# Patient Record
Sex: Female | Born: 1965 | Race: Black or African American | Hispanic: No | Marital: Single | Smoking: Current every day smoker
Health system: Southern US, Community
[De-identification: ages and names within clinical notes are randomized; demographics above are authoritative.]

## PROBLEM LIST (undated history)

## (undated) DIAGNOSIS — J40 Bronchitis, not specified as acute or chronic: Secondary | ICD-10-CM

## (undated) DIAGNOSIS — I10 Essential (primary) hypertension: Secondary | ICD-10-CM

## (undated) DIAGNOSIS — E119 Type 2 diabetes mellitus without complications: Secondary | ICD-10-CM

## (undated) DIAGNOSIS — F419 Anxiety disorder, unspecified: Secondary | ICD-10-CM

## (undated) DIAGNOSIS — K219 Gastro-esophageal reflux disease without esophagitis: Secondary | ICD-10-CM

## (undated) DIAGNOSIS — M792 Neuralgia and neuritis, unspecified: Secondary | ICD-10-CM

---

## 1998-03-06 ENCOUNTER — Other Ambulatory Visit: Admission: RE | Admit: 1998-03-06 | Discharge: 1998-03-06 | Payer: Self-pay

## 1998-06-04 ENCOUNTER — Emergency Department (HOSPITAL_COMMUNITY): Admission: EM | Admit: 1998-06-04 | Discharge: 1998-06-04 | Payer: Self-pay | Admitting: Emergency Medicine

## 1998-11-25 ENCOUNTER — Emergency Department (HOSPITAL_COMMUNITY): Admission: EM | Admit: 1998-11-25 | Discharge: 1998-11-25 | Payer: Self-pay | Admitting: Emergency Medicine

## 1999-08-16 ENCOUNTER — Encounter: Admission: RE | Admit: 1999-08-16 | Discharge: 1999-08-16 | Payer: Self-pay | Admitting: Hematology and Oncology

## 1999-09-04 ENCOUNTER — Encounter: Admission: RE | Admit: 1999-09-04 | Discharge: 1999-12-03 | Payer: Self-pay | Admitting: Orthopedic Surgery

## 1999-09-25 ENCOUNTER — Encounter: Admission: RE | Admit: 1999-09-25 | Discharge: 1999-09-25 | Payer: Self-pay | Admitting: Internal Medicine

## 1999-12-10 ENCOUNTER — Encounter: Admission: RE | Admit: 1999-12-10 | Discharge: 2000-03-09 | Payer: Self-pay | Admitting: Orthopedic Surgery

## 2000-07-14 ENCOUNTER — Emergency Department (HOSPITAL_COMMUNITY): Admission: EM | Admit: 2000-07-14 | Discharge: 2000-07-14 | Payer: Self-pay | Admitting: Internal Medicine

## 2000-07-15 ENCOUNTER — Emergency Department (HOSPITAL_COMMUNITY): Admission: EM | Admit: 2000-07-15 | Discharge: 2000-07-16 | Payer: Self-pay | Admitting: Emergency Medicine

## 2000-08-14 ENCOUNTER — Encounter: Admission: RE | Admit: 2000-08-14 | Discharge: 2000-08-14 | Payer: Self-pay | Admitting: Hematology and Oncology

## 2000-09-15 ENCOUNTER — Encounter: Admission: RE | Admit: 2000-09-15 | Discharge: 2000-09-15 | Payer: Self-pay | Admitting: Hematology and Oncology

## 2000-09-18 ENCOUNTER — Encounter: Admission: RE | Admit: 2000-09-18 | Discharge: 2000-12-17 | Payer: Self-pay | Admitting: Internal Medicine

## 2000-10-07 ENCOUNTER — Encounter: Admission: RE | Admit: 2000-10-07 | Discharge: 2000-10-07 | Payer: Self-pay | Admitting: Internal Medicine

## 2000-12-08 ENCOUNTER — Encounter: Admission: RE | Admit: 2000-12-08 | Discharge: 2000-12-08 | Payer: Self-pay

## 2001-01-20 ENCOUNTER — Encounter: Admission: RE | Admit: 2001-01-20 | Discharge: 2001-01-20 | Payer: Self-pay | Admitting: Hematology and Oncology

## 2001-02-06 ENCOUNTER — Emergency Department (HOSPITAL_COMMUNITY): Admission: EM | Admit: 2001-02-06 | Discharge: 2001-02-06 | Payer: Self-pay | Admitting: Emergency Medicine

## 2001-04-13 ENCOUNTER — Encounter: Admission: RE | Admit: 2001-04-13 | Discharge: 2001-04-13 | Payer: Self-pay | Admitting: Internal Medicine

## 2001-04-25 ENCOUNTER — Emergency Department (HOSPITAL_COMMUNITY): Admission: EM | Admit: 2001-04-25 | Discharge: 2001-04-26 | Payer: Self-pay | Admitting: Emergency Medicine

## 2001-05-04 ENCOUNTER — Emergency Department (HOSPITAL_COMMUNITY): Admission: EM | Admit: 2001-05-04 | Discharge: 2001-05-04 | Payer: Self-pay | Admitting: Emergency Medicine

## 2001-05-12 ENCOUNTER — Emergency Department (HOSPITAL_COMMUNITY): Admission: EM | Admit: 2001-05-12 | Discharge: 2001-05-12 | Payer: Self-pay | Admitting: *Deleted

## 2001-08-13 ENCOUNTER — Emergency Department (HOSPITAL_COMMUNITY): Admission: EM | Admit: 2001-08-13 | Discharge: 2001-08-13 | Payer: Self-pay | Admitting: Emergency Medicine

## 2001-10-14 ENCOUNTER — Encounter: Admission: RE | Admit: 2001-10-14 | Discharge: 2001-10-14 | Payer: Self-pay | Admitting: Internal Medicine

## 2001-10-14 ENCOUNTER — Ambulatory Visit (HOSPITAL_COMMUNITY): Admission: RE | Admit: 2001-10-14 | Discharge: 2001-10-14 | Payer: Self-pay | Admitting: Internal Medicine

## 2001-10-26 ENCOUNTER — Encounter: Admission: RE | Admit: 2001-10-26 | Discharge: 2002-01-24 | Payer: Self-pay | Admitting: Internal Medicine

## 2001-11-19 ENCOUNTER — Encounter: Admission: RE | Admit: 2001-11-19 | Discharge: 2001-11-19 | Payer: Self-pay | Admitting: Obstetrics

## 2001-12-03 ENCOUNTER — Encounter: Admission: RE | Admit: 2001-12-03 | Discharge: 2001-12-03 | Payer: Self-pay | Admitting: Obstetrics

## 2002-02-05 ENCOUNTER — Inpatient Hospital Stay (HOSPITAL_COMMUNITY): Admission: AD | Admit: 2002-02-05 | Discharge: 2002-02-05 | Payer: Self-pay | Admitting: Obstetrics and Gynecology

## 2002-02-05 ENCOUNTER — Encounter: Payer: Self-pay | Admitting: *Deleted

## 2002-02-09 ENCOUNTER — Encounter: Admission: RE | Admit: 2002-02-09 | Discharge: 2002-02-09 | Payer: Self-pay | Admitting: *Deleted

## 2002-02-10 ENCOUNTER — Ambulatory Visit (HOSPITAL_COMMUNITY): Admission: RE | Admit: 2002-02-10 | Discharge: 2002-02-10 | Payer: Self-pay | Admitting: *Deleted

## 2002-02-15 ENCOUNTER — Emergency Department (HOSPITAL_COMMUNITY): Admission: EM | Admit: 2002-02-15 | Discharge: 2002-02-15 | Payer: Self-pay

## 2003-09-28 ENCOUNTER — Encounter: Admission: RE | Admit: 2003-09-28 | Discharge: 2003-09-28 | Payer: Self-pay | Admitting: *Deleted

## 2003-10-12 ENCOUNTER — Encounter: Admission: RE | Admit: 2003-10-12 | Discharge: 2003-10-12 | Payer: Self-pay | Admitting: *Deleted

## 2003-10-13 ENCOUNTER — Ambulatory Visit (HOSPITAL_COMMUNITY): Admission: RE | Admit: 2003-10-13 | Discharge: 2003-10-13 | Payer: Self-pay | Admitting: *Deleted

## 2003-11-10 ENCOUNTER — Inpatient Hospital Stay (HOSPITAL_COMMUNITY): Admission: AD | Admit: 2003-11-10 | Discharge: 2003-11-12 | Payer: Self-pay | Admitting: Family Medicine

## 2003-12-28 ENCOUNTER — Encounter: Admission: RE | Admit: 2003-12-28 | Discharge: 2003-12-28 | Payer: Self-pay | Admitting: *Deleted

## 2004-01-12 ENCOUNTER — Encounter: Admission: RE | Admit: 2004-01-12 | Discharge: 2004-01-12 | Payer: Self-pay | Admitting: *Deleted

## 2004-01-26 ENCOUNTER — Encounter: Admission: RE | Admit: 2004-01-26 | Discharge: 2004-01-26 | Payer: Self-pay | Admitting: *Deleted

## 2004-03-01 ENCOUNTER — Encounter: Admission: RE | Admit: 2004-03-01 | Discharge: 2004-03-01 | Payer: Self-pay | Admitting: *Deleted

## 2004-03-08 ENCOUNTER — Encounter: Admission: RE | Admit: 2004-03-08 | Discharge: 2004-03-08 | Payer: Self-pay | Admitting: Family Medicine

## 2004-03-09 ENCOUNTER — Inpatient Hospital Stay (HOSPITAL_COMMUNITY): Admission: AD | Admit: 2004-03-09 | Discharge: 2004-03-09 | Payer: Self-pay | Admitting: Obstetrics and Gynecology

## 2004-03-22 ENCOUNTER — Inpatient Hospital Stay (HOSPITAL_COMMUNITY): Admission: AD | Admit: 2004-03-22 | Discharge: 2004-03-25 | Payer: Self-pay | Admitting: Obstetrics and Gynecology

## 2004-03-26 ENCOUNTER — Inpatient Hospital Stay (HOSPITAL_COMMUNITY): Admission: AD | Admit: 2004-03-26 | Discharge: 2004-03-29 | Payer: Self-pay | Admitting: Obstetrics & Gynecology

## 2004-04-05 ENCOUNTER — Inpatient Hospital Stay (HOSPITAL_COMMUNITY): Admission: AD | Admit: 2004-04-05 | Discharge: 2004-04-05 | Payer: Self-pay | Admitting: Obstetrics & Gynecology

## 2010-11-24 ENCOUNTER — Encounter: Payer: Self-pay | Admitting: Obstetrics & Gynecology

## 2014-04-09 ENCOUNTER — Emergency Department (HOSPITAL_COMMUNITY)
Admission: EM | Admit: 2014-04-09 | Discharge: 2014-04-09 | Disposition: A | Discharge status: Home / Self Care | Payer: Medicaid Other | Source: Home / Self Care | Attending: Emergency Medicine | Admitting: Emergency Medicine

## 2014-04-09 ENCOUNTER — Encounter (HOSPITAL_COMMUNITY): Payer: Self-pay | Admitting: Emergency Medicine

## 2014-04-09 ENCOUNTER — Emergency Department (HOSPITAL_COMMUNITY): Payer: Medicaid Other

## 2014-04-09 DIAGNOSIS — Z5900 Homelessness unspecified: Secondary | ICD-10-CM

## 2014-04-09 DIAGNOSIS — K219 Gastro-esophageal reflux disease without esophagitis: Secondary | ICD-10-CM | POA: Diagnosis present

## 2014-04-09 DIAGNOSIS — G589 Mononeuropathy, unspecified: Secondary | ICD-10-CM

## 2014-04-09 DIAGNOSIS — F1110 Opioid abuse, uncomplicated: Secondary | ICD-10-CM | POA: Insufficient documentation

## 2014-04-09 DIAGNOSIS — E139 Other specified diabetes mellitus without complications: Secondary | ICD-10-CM

## 2014-04-09 DIAGNOSIS — Z794 Long term (current) use of insulin: Secondary | ICD-10-CM

## 2014-04-09 DIAGNOSIS — Z79899 Other long term (current) drug therapy: Secondary | ICD-10-CM | POA: Insufficient documentation

## 2014-04-09 DIAGNOSIS — R1013 Epigastric pain: Secondary | ICD-10-CM | POA: Insufficient documentation

## 2014-04-09 DIAGNOSIS — E119 Type 2 diabetes mellitus without complications: Secondary | ICD-10-CM

## 2014-04-09 DIAGNOSIS — M79606 Pain in leg, unspecified: Secondary | ICD-10-CM

## 2014-04-09 DIAGNOSIS — F411 Generalized anxiety disorder: Secondary | ICD-10-CM

## 2014-04-09 DIAGNOSIS — F1510 Other stimulant abuse, uncomplicated: Secondary | ICD-10-CM | POA: Insufficient documentation

## 2014-04-09 DIAGNOSIS — M79609 Pain in unspecified limb: Secondary | ICD-10-CM | POA: Insufficient documentation

## 2014-04-09 DIAGNOSIS — T50992A Poisoning by other drugs, medicaments and biological substances, intentional self-harm, initial encounter: Secondary | ICD-10-CM | POA: Diagnosis present

## 2014-04-09 DIAGNOSIS — F3132 Bipolar disorder, current episode depressed, moderate: Secondary | ICD-10-CM

## 2014-04-09 DIAGNOSIS — I10 Essential (primary) hypertension: Secondary | ICD-10-CM | POA: Insufficient documentation

## 2014-04-09 DIAGNOSIS — F17200 Nicotine dependence, unspecified, uncomplicated: Secondary | ICD-10-CM | POA: Diagnosis present

## 2014-04-09 DIAGNOSIS — F329 Major depressive disorder, single episode, unspecified: Secondary | ICD-10-CM

## 2014-04-09 DIAGNOSIS — Y92009 Unspecified place in unspecified non-institutional (private) residence as the place of occurrence of the external cause: Secondary | ICD-10-CM

## 2014-04-09 DIAGNOSIS — F3289 Other specified depressive episodes: Secondary | ICD-10-CM | POA: Insufficient documentation

## 2014-04-09 DIAGNOSIS — T50912A Poisoning by multiple unspecified drugs, medicaments and biological substances, intentional self-harm, initial encounter: Secondary | ICD-10-CM

## 2014-04-09 DIAGNOSIS — F22 Delusional disorders: Secondary | ICD-10-CM

## 2014-04-09 DIAGNOSIS — T502X1A Poisoning by carbonic-anhydrase inhibitors, benzothiadiazides and other diuretics, accidental (unintentional), initial encounter: Secondary | ICD-10-CM | POA: Diagnosis present

## 2014-04-09 DIAGNOSIS — F419 Anxiety disorder, unspecified: Secondary | ICD-10-CM

## 2014-04-09 DIAGNOSIS — T50991A Poisoning by other drugs, medicaments and biological substances, accidental (unintentional), initial encounter: Principal | ICD-10-CM | POA: Diagnosis present

## 2014-04-09 DIAGNOSIS — T50902A Poisoning by unspecified drugs, medicaments and biological substances, intentional self-harm, initial encounter: Secondary | ICD-10-CM | POA: Diagnosis present

## 2014-04-09 DIAGNOSIS — G629 Polyneuropathy, unspecified: Secondary | ICD-10-CM

## 2014-04-09 DIAGNOSIS — F3130 Bipolar disorder, current episode depressed, mild or moderate severity, unspecified: Secondary | ICD-10-CM | POA: Diagnosis present

## 2014-04-09 DIAGNOSIS — F32A Depression, unspecified: Secondary | ICD-10-CM

## 2014-04-09 DIAGNOSIS — E876 Hypokalemia: Secondary | ICD-10-CM | POA: Diagnosis present

## 2014-04-09 DIAGNOSIS — Z6835 Body mass index (BMI) 35.0-35.9, adult: Secondary | ICD-10-CM

## 2014-04-09 HISTORY — DX: Essential (primary) hypertension: I10

## 2014-04-09 HISTORY — DX: Anxiety disorder, unspecified: F41.9

## 2014-04-09 HISTORY — DX: Gastro-esophageal reflux disease without esophagitis: K21.9

## 2014-04-09 HISTORY — DX: Type 2 diabetes mellitus without complications: E11.9

## 2014-04-09 LAB — URINALYSIS, ROUTINE W REFLEX MICROSCOPIC
Bilirubin Urine: NEGATIVE
Glucose, UA: 500 mg/dL — AB
Hgb urine dipstick: NEGATIVE
Ketones, ur: NEGATIVE mg/dL
Leukocytes, UA: NEGATIVE
Nitrite: NEGATIVE
Protein, ur: NEGATIVE mg/dL
Specific Gravity, Urine: 1.013 (ref 1.005–1.030)
Urobilinogen, UA: 0.2 mg/dL (ref 0.0–1.0)
pH: 7 (ref 5.0–8.0)

## 2014-04-09 LAB — COMPREHENSIVE METABOLIC PANEL
ALT: 13 U/L (ref 0–35)
AST: 10 U/L (ref 0–37)
Albumin: 3.7 g/dL (ref 3.5–5.2)
Alkaline Phosphatase: 75 U/L (ref 39–117)
BUN: 18 mg/dL (ref 6–23)
CO2: 26 mEq/L (ref 19–32)
Calcium: 9 mg/dL (ref 8.4–10.5)
Chloride: 99 mEq/L (ref 96–112)
Creatinine, Ser: 0.64 mg/dL (ref 0.50–1.10)
GFR calc Af Amer: >90 mL/min (ref >90)
GFR calc non Af Amer: >90 mL/min (ref >90)
Glucose, Bld: 248 mg/dL — ABNORMAL HIGH (ref 70–99)
Potassium: 3.5 mEq/L — ABNORMAL LOW (ref 3.7–5.3)
Sodium: 138 mEq/L (ref 137–147)
Total Bilirubin: 0.3 mg/dL (ref 0.3–1.2)
Total Protein: 7 g/dL (ref 6.0–8.3)

## 2014-04-09 LAB — CBC WITH DIFFERENTIAL/PLATELET
Basophils Absolute: 0.1 10*3/uL (ref 0.0–0.1)
Basophils Relative: 1 % (ref 0–1)
Eosinophils Absolute: 0.2 10*3/uL (ref 0.0–0.7)
Eosinophils Relative: 3 % (ref 0–5)
HCT: 37.9 % (ref 36.0–46.0)
Hemoglobin: 13 g/dL (ref 12.0–15.0)
Lymphocytes Relative: 31 % (ref 12–46)
Lymphs Abs: 2.2 10*3/uL (ref 0.7–4.0)
MCH: 31.9 pg (ref 26.0–34.0)
MCHC: 34.3 g/dL (ref 30.0–36.0)
MCV: 93.1 fL (ref 78.0–100.0)
Monocytes Absolute: 0.8 10*3/uL (ref 0.1–1.0)
Monocytes Relative: 11 % (ref 3–12)
Neutro Abs: 3.8 10*3/uL (ref 1.7–7.7)
Neutrophils Relative %: 54 % (ref 43–77)
Platelets: 345 10*3/uL (ref 150–400)
RBC: 4.07 MIL/uL (ref 3.87–5.11)
RDW: 13.2 % (ref 11.5–15.5)
WBC: 7.1 10*3/uL (ref 4.0–10.5)

## 2014-04-09 LAB — RAPID URINE DRUG SCREEN, HOSP PERFORMED
Amphetamines: POSITIVE — AB
Barbiturates: NOT DETECTED
Benzodiazepines: NOT DETECTED
Cocaine: NOT DETECTED
Opiates: POSITIVE — AB
Tetrahydrocannabinol: NOT DETECTED

## 2014-04-09 LAB — LIPASE, BLOOD: Lipase: 38 U/L (ref 11–59)

## 2014-04-09 LAB — TROPONIN I: Troponin I: <0.3 ng/mL (ref <0.30)

## 2014-04-09 MED ORDER — TRAMADOL HCL 50 MG PO TABS: 50.0000 mg | ORAL_TABLET | Freq: Once | ORAL | Status: DC

## 2014-04-09 MED ORDER — MORPHINE SULFATE 4 MG/ML IJ SOLN
4.0000 mg | Freq: Once | INTRAMUSCULAR | Status: AC
  Administered 2014-04-09: 4 mg via INTRAVENOUS
  Filled 2014-04-09: qty 1

## 2014-04-09 MED ORDER — TRAMADOL HCL 50 MG PO TABS
50.0000 mg | ORAL_TABLET | Freq: Once | ORAL | Status: AC
  Administered 2014-04-09: 50 mg via ORAL
  Filled 2014-04-09: qty 1

## 2014-04-09 MED ORDER — LORAZEPAM 1 MG PO TABS
1.0000 mg | ORAL_TABLET | Freq: Once | ORAL | Status: AC | PRN
  Administered 2014-04-09: 1 mg via ORAL
  Filled 2014-04-09: qty 1

## 2014-04-09 MED ORDER — IOHEXOL 300 MG/ML  SOLN
100.0000 mL | Freq: Once | INTRAMUSCULAR | Status: AC | PRN
  Administered 2014-04-09: 100 mL via INTRAVENOUS

## 2014-04-09 MED ORDER — TRAMADOL HCL 50 MG PO TABS: 50.0000 mg | ORAL_TABLET | Freq: Four times a day (QID) | ORAL | Status: DC | PRN

## 2014-04-09 MED ORDER — IOHEXOL 300 MG/ML  SOLN
50.0000 mL | Freq: Once | INTRAMUSCULAR | Status: AC | PRN
  Administered 2014-04-09: 50 mL via ORAL

## 2014-04-09 NOTE — ED Notes (Signed)
Pt presents with c/o left leg pain. This is the third time that pt has been seen in the ER today. Pt was d/c less than three hours ago and has not left the lobby since then. Pt says that her left leg has been hurting and she wants something for pain. Pt is ambulatory to triage.

## 2014-04-09 NOTE — Discharge Instructions (Signed)
Do not hesitate to return to the Emergency Department for any new, worsening or concerning symptoms.   If you do not have a primary care doctor you can establish one at the   [facility]: [address] [place] [address] [phone]  After you establish care. Let them know you were seen in the emergency room. They must obtain records for further management.    Emergency Department Resource Guide 1) Find a Doctor and Pay Out of Pocket Although you won't have to find out who is covered by your insurance plan, it is a good idea to ask around and get recommendations. You will then need to call the office and see if the doctor you have chosen will accept you as a new patient and what types of options they offer for patients who are self-pay. Some doctors offer discounts or will set up payment plans for their patients who do not have insurance, but you will need to ask so you aren't surprised when you get to your appointment.  2) Contact Your Local Health Department Not all health departments have doctors that can see patients for sick visits, but many do, so it is worth a call to see if yours does. If you don't know where your local health department is, you can check in your phone book. The CDC also has a tool to help you locate your state's health department, and many state websites also have listings of all of their local health departments.  3) Find a Walk-in Clinic If your illness is not likely to be very severe or complicated, you may want to try a walk in clinic. These are popping up all over the country in pharmacies, drugstores, and shopping centers. They're usually staffed by nurse practitioners or physician assistants that have been trained to treat common illnesses and complaints. They're usually fairly quick and inexpensive. However, if you have serious medical issues or chronic medical problems, these are probably not your best option.  No Primary Care  Doctor: - Call Health Connect at  [phone] - they can help you locate a primary care doctor that  accepts your insurance, provides certain services, etc. - Physician Referral Service- [phone]  Chronic Pain Problems: Organization         Address  Phone   Notes  [facility]  [phone] Patients need to be referred by their primary care doctor.   Medication Assistance: Organization         Address  Phone   Notes  [facility] Medication [facility] [address] [place] [place], [place] [address] [phone] --Must be a resident of [facility] -- Must have NO insurance coverage whatsoever (no Medicaid/ Medicare, etc.) -- The pt. MUST have a primary care doctor that directs their care regularly and follows them in the community   [facility]  [phone]   [employer]  [phone]    Agencies that provide inexpensive medical care: Organization         Address  Phone   Notes  [facility]  [phone]   [facility]    [phone]   [facility] [address] [place], [place] [address] [phone]   [facility] [address] [place], [place] [phone]   Planned Parenthood    [phone]   [facility]    [phone]   [facility] and [facility]  [address] [address], [place] Phone:  [phone], Fax:  [phone] Hours of Operation:  9 am - 6 pm, M-F.  Also accepts Medicaid/Medicare and self-pay.  [facility] for [place] [address] [place] [address], [address], [place] Phone: [phone], Fax: [phone]. Hours of Operation:  8:30 am - 5:30 pm, M-F.  Also accepts Medicaid and self-pay.  [facility] [address], [place] Phone: [phone]   [place], [place], [place] [phone], Ext. [phone] Mondays & Thursdays: 7-9 AM.  First 15 patients are seen on a first  come, first serve basis.    [place] Providers:  Organization         Address  Phone   Notes  [facility] [address], [place] [phone] Also accepts self-pay patients.  [facility] [address], [place]  [phone]   [place], [address], [place] [phone]   [facility] [address], [place] [phone]   Lucianne Lei [address], [place]   [phone] Only accepts [place] Access [place] patients after they have their name applied to their card.   Self-Pay (no insurance) in [facility]:  Organization         Address  Phone   Notes  Sickle Cell Patients, [facility] [place] [phone]   [facility] [place] [phone]   [facility] [place]  [place], [place],  [phone]   [facility]/Dr. Osei-Bonsu  [address] [place] or [address], [place] [phone] Phone number for both [place] and [place] locations is the same.  Urgent Medical and [facility] [address], [place] [phone]   [facility] [address] [place] or [address] [phone]   [facility] [address] [place] [phone], phone; [phone], fax Sees patients 1st and 3rd Saturday of every month.  Must not qualify for public or private insurance (i.e. Medicaid, Medicare, [place] Health Choice, Veterans' Benefits)  Household income should be no more than 200% of the poverty level The clinic cannot treat you if you are pregnant or think you are pregnant  Sexually transmitted diseases are not treated at the clinic.    Dental Care: Organization          Address  Phone  Notes  [facility] Department of [facility] [place] [phone] Accepts children up to age 5 who are enrolled in [place] or [place]; pregnant women with a Medicaid card; and children who have applied for Medicaid or [place] Health Choice, but were declined, whose parents can pay a reduced fee at time of service.  [facility] Department of [facility]  [address], [place] [phone] Accepts children up to age 14 who are enrolled in [place] or [place]; pregnant women with a Medicaid card; and children who have applied for Medicaid or  Health Choice, but were declined, whose parents can pay a reduced fee at time of service.  [facility] Adult Dental Access PROGRAM  [place], [place] [phone]  Patients are seen by appointment only. Walk-ins are not accepted. [place] will see patients 62 years of age and older. Monday - Tuesday (8am-5pm) Most Wednesdays (8:30-5pm) $30 per visit, cash only  [facility] Adult Dental Access PROGRAM  [address], [facility] [phone] Patients are seen by appointment only. Walk-ins are not accepted. [place] will see patients 76 years of age and older. One Wednesday Evening (Monthly: Volunteer Based).  $30 per visit, cash only  [place]  [phone] for adults; Children under age 48, call Graduate Pediatric Dentistry at [phone]. Children aged 49-14, please call [phone] to request a pediatric application.  Dental services are provided in all areas of dental care including fillings, crowns and bridges, complete and partial dentures, implants, gum treatment, root canals, and extractions. Preventive care is also provided. Treatment is provided to both adults and children. Patients are selected via a lottery and there is often a waiting list.   [facility] [address] [place]  [phone] [email or web address]   [facility] [address] [place], [place] [phone], Ext. [phone] Second and Fourth Thursday of each month, opens at 6:30 AM; Clinic ends at 9 AM.  Patients are seen on a first-come first-served basis, and a limited number are seen during each clinic.   [facility]  [address] Hillard Danker [place], [place] [phone]   Eligibility Requirements You must have lived in [place], [place], or [place] counties for at least the last three months.   You cannot be eligible for state or federal sponsored [employer], including [employer], [place], or [employer].   You generally cannot be eligible for healthcare insurance through your employer.    How to apply: Eligibility screenings are held every Tuesday and Wednesday afternoon from 1:00 pm until 4:00 pm. You do not need an appointment for the interview!  [facility] [address] [place], [place]   [place]  [place] Department  [place]  [phone]    Behavioral Health Resources in the Community: Intensive Outpatient Programs Organization         Address  Phone  Notes  [place]. [address] [place], [place] [phone]   [facility] [place] [facility] Outpatient [address], [place], [place]   ADS: [facility] [address] [place], [place]   [place] [address],  [place], [place] or [phone]   Substance Abuse Resources Organization         Address  Phone  Notes  Al[facility] Drug Services  [phone]   [place]  [phone]   [facility] [place]   Chinita Pester  [phone]   Residential & Outpatient Substance Abuse Program  [phone]   Psychological  Services Organization         Address  Phone  Notes  [facility] [place]  [place]  [phone]   [place] [address], [place] or [phone]    Mobile Crisis Teams Organization         Address  Phone  Notes  [facility], Mobile Crisis Care Unit  [phone]   [facility]  [address] [place], [place]   [facility] [address], [place]  [place] [phone]    Self-Help/Support Groups Organization         Address  Phone             Notes  [facility] [place] - variety of support groups  [place] Call for more information  Narcotics Anonymous (NA), [facility] [address], [employer] [place]  2 meetings at this location   Special educational needs teacher         Address  Phone  Notes  [facility]idential Treatment [place],    [place]  [phone]   [facility]  [address] [place] [address], [place], [place]   [place], [place] [phone] Admissions: 8am-3pm M-F  Incentives Substance [place] [address],    [place], [place] [phone]   [facility] [address] [place], [place], [employer]   [facility] [address]  [place], [place]   [facility] - Intensive Outpatient [address], [place], [place]   [facility] [place]  [place], [place] [phone] or [phone]   [facility] ([facility]) [address], [place], [place] Accepts Medicaid  [facility] [place] [address]  [place] [phone] Substance Abuse/Addiction Treatment   [facility] Organization         Address  Phone  Notes  [facility]  [phone]   Domenic Schwab, PhD [address] [place], [place]   [phone] or [phone]   Luis Lopez [place], [place] [phone]   [facility] [address], [place], [place] [phone] Insurance/Medicaid/sponsorship through [facility] and Families [address], [address]                                    [place], [place] [phone] [place] [address], [place] [phone]    Dr. Adele Schilder  [phone]   [facility] Dept. 1) [address], [place] 2) [place] 3)  [place] [address], [place] [phone]  [phone]   [place] [phone] or [phone] (After Hours)

## 2014-04-09 NOTE — ED Notes (Signed)
Per PA Joni Reining pt does not want lab drawn at this time

## 2014-04-09 NOTE — ED Provider Notes (Signed)
Medical screening examination/treatment/procedure(s) were conducted as a shared visit with non-physician practitioner(s) and myself.  I personally evaluated the patient during the encounter.  Patient is oriented to person place and time sheet states she has anxiety and depression but denies homicidal ideation suicidal ideation threat to harm her self or others or hallucinations and states she is still able to function in a safe place at the shelter where she is temporarily staying and she states she came to the emergency room seeking assistance so she can find resources to help her with her chronic anxiety and chronic depression and she does not want to stay in the emergency department any longer at this time she is happy to be discharged with a resource guide for behavioral health and will followup with [facility] or behavioral health hospital. It does not appear that she needs to be involuntarily committed at this time. [phone]   Hurman Horn, MD 04/10/14 1451

## 2014-04-09 NOTE — ED Notes (Signed)
Green tank top, black sports bra, brown panties, blue shorts,white socks(patient refuses to put yellow socks on)

## 2014-04-09 NOTE — ED Notes (Signed)
Bed: XK48 Expected date:  Expected time:  Means of arrival:  Comments: EMS 48yo anxiety

## 2014-04-09 NOTE — ED Notes (Signed)
Original call came into EMS as anxiety once they calmed patient down,  Her abdominal pain and left leg pain are now her complaint,  They have been hurting about 3 days

## 2014-04-09 NOTE — ED Notes (Addendum)
Pt is yelling at staff members and is very aggressive with anyone who walks into her room at this time. [employer] and security at bedside to speak with pt as she is saying that people in the lobby are threatening to hurt her. Explained to pt that we could get the [facility] officer into her room to address her concerns and take down information about the threats that were being made to her. Pt was d/c with a diagnosis of paranoia earlier and has been aggressive since she checked back in.

## 2014-04-09 NOTE — Discharge Instructions (Signed)
Take 

## 2014-04-09 NOTE — ED Notes (Signed)
Pt given warm blankets for comfort measures

## 2014-04-09 NOTE — ED Provider Notes (Signed)
CSN: [id number]     Arrival date & time 04/09/14  2059 History   None   This chart was scribed for non-physician practitioner, Ebbie Ridge, PA, working with Hurman Horn, MD by Marica Otter, ED Scribe. This patient was seen in room WTR1/WLPT1 and the patient's care was started at 10:21 PM.  Chief Complaint  Patient presents with  . Leg Pain    The history is provided by the patient. No language interpreter was used.   HPI Comments: Terri SCHARES is a 48 y.o. female, with a history of DM, HTN, GERD and anxiety, who presents to the Emergency Department complaining of left leg pain onset 1 year ago. Pt reports that the pain begins in her left thigh and travels down the length of her leg. Pt reports that the pain "shoots" down her leg.   Pt was seen at the ED on 2 other occassions today.   Past Medical History  Diagnosis Date  . Diabetes mellitus without complication   . Hypertension   . GERD (gastroesophageal reflux disease)   . Anxiety    History reviewed. No pertinent past surgical history. No family history on file. History  Substance Use Topics  . Smoking status: Current Every Day Smoker  . Smokeless tobacco: Not on file  . Alcohol Use: No   OB History   Grav Para Term Preterm Abortions TAB SAB Ect Mult Living                 Review of Systems  A complete 10 system review of systems was obtained and all systems are negative except as noted in the HPI and PMH.    Allergies  Review of patient's allergies indicates no known allergies.  Home Medications   Prior to Admission medications   Medication Sig Start Date End Date Taking? Authorizing Provider  gabapentin (NEURONTIN) 300 MG capsule Take 300 mg by mouth 3 (three) times daily.   Yes Historical Provider, MD  hydrochlorothiazide (HYDRODIURIL) 25 MG tablet Take 25 mg by mouth daily.   Yes Historical Provider, MD  ibuprofen (ADVIL,MOTRIN) 800 MG tablet Take 800 mg by mouth every 8 (eight) hours as needed for moderate  pain.   Yes Historical Provider, MD  insulin detemir (LEVEMIR) 100 UNIT/ML injection Inject 35 Units into the skin at bedtime.   Yes Historical Provider, MD  metFORMIN (GLUCOPHAGE) 500 MG tablet Take 1,000 mg by mouth daily with breakfast.   Yes Historical Provider, MD  ranitidine (ZANTAC) 150 MG tablet Take 150 mg by mouth 2 (two) times daily.   Yes Historical Provider, MD  risperiDONE (RISPERDAL) 2 MG tablet Take 2 mg by mouth at bedtime.   Yes Historical Provider, MD   BP 133/74  Pulse 94  Temp(Src) 98.7 F (37.1 C) (Oral)  Resp 14  SpO2 99% Physical Exam  Nursing note and vitals reviewed. Constitutional: She is oriented to person, place, and time. She appears well-developed and well-nourished. No distress.  HENT:  Head: Normocephalic and atraumatic.  Eyes: Pupils are equal, round, and reactive to light.  Pulmonary/Chest: Effort normal.  Musculoskeletal: Normal range of motion. She exhibits tenderness. She exhibits no edema.  Neurological: She is alert and oriented to person, place, and time.  Skin: Skin is warm and dry.  Psychiatric: She has a normal mood and affect. Her behavior is normal.    ED Course  Procedures (including critical care time) DIAGNOSTIC STUDIES: Oxygen Saturation is 98% on RA, normal by my interpretation.  COORDINATION OF CARE: 10:23 PM-Discussed treatment plan which includes meds with pt. Patient verbalizes understanding and agrees with treatment plan.  Labs Review Labs Reviewed - No data to display  Imaging Review Ct Abdomen Pelvis W Contrast  04/09/2014   CLINICAL DATA:  Abdominal pain.  EXAM: CT ABDOMEN AND PELVIS WITH CONTRAST  TECHNIQUE: Multidetector CT imaging of the abdomen and pelvis was performed using the standard protocol following bolus administration of intravenous contrast.  CONTRAST:  50mL OMNIPAQUE IOHEXOL 300 MG/ML SOLN, 100mL OMNIPAQUE IOHEXOL 300 MG/ML SOLN  COMPARISON:  11/11/2003.  FINDINGS: The lung bases are clear except for  streaky areas of atelectasis and scarring. Heart is normal in size. No pericardial effusion.  The liver is unremarkable. Multiple small gallstones are noted the gallbladder. No common bile duct dilatation. The pancreas appears normal. The spleen is normal in size no focal lesions. The adrenal glands and kidneys are unremarkable.  The stomach, duodenum, small bowel and colon are grossly normal. No inflammatory changes, mass lesions or obstructive findings. The appendix is normal. No mesenteric or retroperitoneal mass or adenopathy. The aorta and branch vessels are patent.  The uterus contains an IUD. There is a calcified uterine fibroid noted. Tubal ligation clips are present. The ovaries are normal. The bladder is normal. No pelvic mass, adenopathy or free pelvic fluid collections. No inguinal mass or adenopathy.  The bony structures are unremarkable.  IMPRESSION: No acute abdominal/ pelvic findings, mass lesions or adenopathy.  Chololithiasis.   Electronically Signed   By: Loralie ChampagneMark  Gallerani M.D.   On: 04/09/2014 04:00   Patient is here for treatment for her diabetic neuropathy.  The patient, states she's on gabapentin.  For this.  She states the pain shoots from her knee down to her feet, especially on the left.  The patient is advised followup with her primary care Dr. told to return here as needed.  This is the patient's third visit to the emergency department.  She did not have any other complaints at this time  I personally performed the services described in this documentation, which was scribed in my presence. The recorded information has been reviewed and is accurate.   Carlyle Dollyhristopher W Mella Inclan, PA-C 04/10/14 [phone]
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
## 2014-04-09 NOTE — Discharge Instructions (Signed)
Return here as needed. Follow up with your doctor. °

## 2014-04-09 NOTE — ED Provider Notes (Signed)
CSN: [id number]     Arrival date & time 04/09/14  0034 History   First MD Initiated Contact with Patient 04/09/14 0049     Chief Complaint  Patient presents with  . Anxiety  . Abdominal Pain  . Leg Pain     (Consider location/radiation/quality/duration/timing/severity/associated sxs/prior Treatment) HPI Comments: 48 year old female with history of diabetes, high blood pressure, reflux, anxiety presents with abdominal pain and anxiety symptoms. For the past 3 days patient's had worsening epigastric pain sharp radiating down the suprapubic region. No specific worsening factors last up to 30 minutes at a time and does not radiate to the back. Not worse after eating. No chest pain shortness of breath fevers or chills. Patient never had this pain before. No history of kidney stones or pancreas problems. No significant alcohol abuse history. Patient has been extremely stressed with multiple issues recently which is causing anxiety worsened however she doesn't normally get abdominal pain like this. She had a lower leg pain on the left worse with palpation in the posterior aspect, no weakness or numbness. No blood clot history recent surgery or active cancer. Leg pain and abdominal pain do not occur at the same time.  Patient is a 48 y.o. female presenting with anxiety, abdominal pain, and leg pain. The history is provided by the patient.  Anxiety Associated symptoms include abdominal pain. Pertinent negatives include no chest pain, no headaches and no shortness of breath.  Abdominal Pain Associated symptoms: no chest pain, no chills, no dysuria, no fever, no shortness of breath and no vomiting   Leg Pain Associated symptoms: no back pain, no fever and no neck pain     Past Medical History  Diagnosis Date  . Diabetes mellitus without complication   . Hypertension   . GERD (gastroesophageal reflux disease)   . Anxiety    History reviewed. No pertinent past surgical history. No family history on  file. History  Substance Use Topics  . Smoking status: Not on file  . Smokeless tobacco: Not on file  . Alcohol Use: Not on file   OB History   Grav Para Term Preterm Abortions TAB SAB Ect Mult Living                 Review of Systems  Constitutional: Negative for fever and chills.  HENT: Negative for congestion.   Eyes: Negative for visual disturbance.  Respiratory: Negative for shortness of breath.   Cardiovascular: Negative for chest pain.  Gastrointestinal: Positive for abdominal pain. Negative for vomiting.  Genitourinary: Negative for dysuria and flank pain.  Musculoskeletal: Negative for back pain, neck pain and neck stiffness.  Skin: Negative for rash.  Neurological: Negative for light-headedness and headaches.  Psychiatric/Behavioral: The patient is nervous/anxious.       Allergies  Review of patient's allergies indicates no known allergies.  Home Medications   Prior to Admission medications   Medication Sig Start Date End Date Taking? Authorizing Provider  gabapentin (NEURONTIN) 300 MG capsule Take 300 mg by mouth 3 (three) times daily.   Yes Historical Provider, MD  hydrochlorothiazide (HYDRODIURIL) 25 MG tablet Take 25 mg by mouth daily.   Yes Historical Provider, MD  ibuprofen (ADVIL,MOTRIN) 800 MG tablet Take 800 mg by mouth every 8 (eight) hours as needed for moderate pain.   Yes Historical Provider, MD  insulin detemir (LEVEMIR) 100 UNIT/ML injection Inject 35 Units into the skin at bedtime.   Yes Historical Provider, MD  metFORMIN (GLUCOPHAGE) 500 MG tablet Take 1,000 mg  by mouth daily with breakfast.   Yes Historical Provider, MD  ranitidine (ZANTAC) 150 MG tablet Take 150 mg by mouth 2 (two) times daily.   Yes Historical Provider, MD  risperiDONE (RISPERDAL) 2 MG tablet Take 2 mg by mouth at bedtime.   Yes Historical Provider, MD   BP 137/68  Pulse 86  Temp(Src) 98.8 F (37.1 C) (Oral)  Resp 16  SpO2 98% Physical Exam  Nursing note and vitals  reviewed. Constitutional: She is oriented to person, place, and time. She appears well-developed and well-nourished.  HENT:  Head: Normocephalic and atraumatic.  Eyes: Conjunctivae are normal. Right eye exhibits no discharge. Left eye exhibits no discharge.  Neck: Normal range of motion. Neck supple. No tracheal deviation present.  Cardiovascular: Normal rate, regular rhythm and intact distal pulses.   Pulmonary/Chest: Effort normal and breath sounds normal.  Abdominal: Soft. She exhibits no distension. There is tenderness (central and epigastric). There is no guarding.  Musculoskeletal: She exhibits no edema and no tenderness.  Patient has no leg tenderness or swelling bilateral. 2+ pulses distally. Normal sensation bilateral lower extremity.  Neurological: She is alert and oriented to person, place, and time.  Skin: Skin is warm. No rash noted.  Psychiatric: Her mood appears anxious.  Mild anxiety however patient able to hold a normal discussion, not suicidal.    ED Course  Procedures (including critical care time) Labs Review Labs Reviewed  URINALYSIS, ROUTINE W REFLEX MICROSCOPIC - Abnormal; Notable for the following:    Glucose, UA 500 (*)    All other components within normal limits  COMPREHENSIVE METABOLIC PANEL - Abnormal; Notable for the following:    Potassium 3.5 (*)    Glucose, Bld 248 (*)    All other components within normal limits  CBC WITH DIFFERENTIAL  LIPASE, BLOOD  TROPONIN I    Imaging Review Ct Abdomen Pelvis W Contrast  04/09/2014   CLINICAL DATA:  Abdominal pain.  EXAM: CT ABDOMEN AND PELVIS WITH CONTRAST  TECHNIQUE: Multidetector CT imaging of the abdomen and pelvis was performed using the standard protocol following bolus administration of intravenous contrast.  CONTRAST:  50mL OMNIPAQUE IOHEXOL 300 MG/ML SOLN, 100mL OMNIPAQUE IOHEXOL 300 MG/ML SOLN  COMPARISON:  11/11/2003.  FINDINGS: The lung bases are clear except for streaky areas of atelectasis and  scarring. Heart is normal in size. No pericardial effusion.  The liver is unremarkable. Multiple small gallstones are noted the gallbladder. No common bile duct dilatation. The pancreas appears normal. The spleen is normal in size no focal lesions. The adrenal glands and kidneys are unremarkable.  The stomach, duodenum, small bowel and colon are grossly normal. No inflammatory changes, mass lesions or obstructive findings. The appendix is normal. No mesenteric or retroperitoneal mass or adenopathy. The aorta and branch vessels are patent.  The uterus contains an IUD. There is a calcified uterine fibroid noted. Tubal ligation clips are present. The ovaries are normal. The bladder is normal. No pelvic mass, adenopathy or free pelvic fluid collections. No inguinal mass or adenopathy.  The bony structures are unremarkable.  IMPRESSION: No acute abdominal/ pelvic findings, mass lesions or adenopathy.  Chololithiasis.   Electronically Signed   By: Loralie ChampagneMark  Gallerani M.D.   On: 04/09/2014 04:00     EKG Interpretation   Date/Time:  Saturday April 09 2014 00:47:43 EDT Ventricular Rate:  81 PR Interval:  137 QRS Duration: 80 QT Interval:  387 QTC Calculation: 449 R Axis:   45 Text Interpretation:  Sinus rhythm Confirmed  by Jodi Mourning  MD, Thomos Domine ([id number])  on 04/09/2014 12:51:43 AM      MDM   Final diagnoses:  Epigastric pain  Anxiety    Patient's anxiety similar to previous but more severe with multiple issues going on recently. With time in ER patient's anxiety has improved. Ativan ordered when necessary.  Abdominal pain different than previous. Nonspecific central plan for abdominal labs and CT scan for further delineation. Abdominal pain is not connected with the leg and I do not feel this is a dissection. Pain medicines ordered. Urinalysis pending.  Patient has no chest pain or shortness of breath however the epigastric pain EKG and screening troponin ordered. EKG no acute findings reviewed. Medications   morphine 4 MG/ML injection 4 mg (4 mg Intravenous Given 04/09/14 0155)  iohexol (OMNIPAQUE) 300 MG/ML solution 50 mL (50 mLs Oral Contrast Given 04/09/14 0157)  LORazepam (ATIVAN) tablet 1 mg (1 mg Oral Given 04/09/14 0251)  iohexol (OMNIPAQUE) 300 MG/ML solution 100 mL (100 mLs Intravenous Contrast Given 04/09/14 0326)    Patient improved in ER. Blood work unremarkable except for hyperglycemia patient has diabetes history. IV fluid bolus given.  CT results reviewed no acute findings, Colace lithiasis seen however patient has no right upper quadrant pain and liver function unremarkable also patient can followup outpatient. Results and differential diagnosis were discussed with the patient/parent/guardian. Close follow up outpatient was discussed, comfortable with the plan.   Filed Vitals:   04/09/14 0045 04/09/14 0315  BP: 137/68 131/73  Pulse: 86 80  Temp: 98.8 F (37.1 C) 97.8 F (36.6 C)  TempSrc: Oral Oral  Resp: 16 16  SpO2: 98% 99%       Enid Skeens, MD 04/09/14 [phone]

## 2014-04-09 NOTE — ED Notes (Signed)
Pt ambulated to bathroom ,, states her pain is down

## 2014-04-09 NOTE — ED Notes (Addendum)
Pt presents with c/o medical clearance. Pt says that she is "not in a good place right now". Pt says that she suffers from anxiety and depression. Pt is saying that she is seeing red dots on the ceiling every time she comes into the room. She says that the dots are only in her particular room. Pt says that she is not hallucinating and she is not crazy but she does see the dot and that we "might as well run something warm through her veins". Pt denies HI but answers "I don't know" when asked if she wants to hurt herself, she says she has no comment on that question. Pt is talking to herself in the room.

## 2014-04-09 NOTE — ED Notes (Addendum)
Pt presents with c/o overdose. Pt has been seen three times today prior to this visit. Earlier pt was diagnosed with paranoia but denied SI/HI and was discharged. [facility] was called to escort pt back to the homeless shelter because she did not have a ride home. Pt reported to [facility] once she got into the car that she overdosed on pills and needed to go back to the hospital. Pt says that she took a handful of hydroxyzine, a handful of risperidone, and the rest of the bottle of HCTZ. Bottle of HCTZ of 30 count quantity that was filled on 04/07/14 is empty, bottle of risperidone has 13 missing from 120 count quantity that was filled on 04/07/14, and bottle of hydroxyzine of 30 count quantity has 11 missing that was filled on 04/07/14. Pt says that she has nothing to live for.

## 2014-04-10 ENCOUNTER — Encounter (HOSPITAL_COMMUNITY): Payer: Self-pay | Admitting: Emergency Medicine

## 2014-04-10 ENCOUNTER — Inpatient Hospital Stay (HOSPITAL_COMMUNITY)
Admission: EM | Admit: 2014-04-10 | Discharge: 2014-04-11 | DRG: 918 | Disposition: A | Discharge status: Other Acute Inpatient Hospital | Payer: Medicaid Other | Attending: Internal Medicine | Admitting: Internal Medicine

## 2014-04-10 DIAGNOSIS — T438X2A Poisoning by other psychotropic drugs, intentional self-harm, initial encounter: Secondary | ICD-10-CM

## 2014-04-10 DIAGNOSIS — F3132 Bipolar disorder, current episode depressed, moderate: Secondary | ICD-10-CM

## 2014-04-10 DIAGNOSIS — T43502A Poisoning by unspecified antipsychotics and neuroleptics, intentional self-harm, initial encounter: Secondary | ICD-10-CM

## 2014-04-10 DIAGNOSIS — E119 Type 2 diabetes mellitus without complications: Secondary | ICD-10-CM | POA: Diagnosis present

## 2014-04-10 DIAGNOSIS — F314 Bipolar disorder, current episode depressed, severe, without psychotic features: Secondary | ICD-10-CM

## 2014-04-10 DIAGNOSIS — T50901A Poisoning by unspecified drugs, medicaments and biological substances, accidental (unintentional), initial encounter: Secondary | ICD-10-CM | POA: Diagnosis present

## 2014-04-10 DIAGNOSIS — T502X1A Poisoning by carbonic-anhydrase inhibitors, benzothiadiazides and other diuretics, accidental (unintentional), initial encounter: Secondary | ICD-10-CM

## 2014-04-10 DIAGNOSIS — E43 Unspecified severe protein-calorie malnutrition: Secondary | ICD-10-CM | POA: Insufficient documentation

## 2014-04-10 DIAGNOSIS — T50911A Poisoning by multiple unspecified drugs, medicaments and biological substances, accidental (unintentional), initial encounter: Secondary | ICD-10-CM | POA: Diagnosis present

## 2014-04-10 DIAGNOSIS — I10 Essential (primary) hypertension: Secondary | ICD-10-CM | POA: Diagnosis present

## 2014-04-10 DIAGNOSIS — T43501A Poisoning by unspecified antipsychotics and neuroleptics, accidental (unintentional), initial encounter: Secondary | ICD-10-CM

## 2014-04-10 DIAGNOSIS — T50992A Poisoning by other drugs, medicaments and biological substances, intentional self-harm, initial encounter: Secondary | ICD-10-CM

## 2014-04-10 DIAGNOSIS — T50902A Poisoning by unspecified drugs, medicaments and biological substances, intentional self-harm, initial encounter: Secondary | ICD-10-CM

## 2014-04-10 DIAGNOSIS — T1491XA Suicide attempt, initial encounter: Secondary | ICD-10-CM

## 2014-04-10 DIAGNOSIS — F322 Major depressive disorder, single episode, severe without psychotic features: Secondary | ICD-10-CM

## 2014-04-10 DIAGNOSIS — T50904A Poisoning by unspecified drugs, medicaments and biological substances, undetermined, initial encounter: Secondary | ICD-10-CM

## 2014-04-10 DIAGNOSIS — T43591A Poisoning by other antipsychotics and neuroleptics, accidental (unintentional), initial encounter: Secondary | ICD-10-CM

## 2014-04-10 DIAGNOSIS — F332 Major depressive disorder, recurrent severe without psychotic features: Secondary | ICD-10-CM

## 2014-04-10 LAB — ACETAMINOPHEN LEVEL

## 2014-04-10 LAB — COMPREHENSIVE METABOLIC PANEL
ALBUMIN: 3.6 g/dL (ref 3.5–5.2)
ALT: 13 U/L (ref 0–35)
AST: 10 U/L (ref 0–37)
Alkaline Phosphatase: 79 U/L (ref 39–117)
BILIRUBIN TOTAL: 0.2 mg/dL — AB (ref 0.3–1.2)
BUN: 14 mg/dL (ref 6–23)
CHLORIDE: 97 meq/L (ref 96–112)
CO2: 26 mEq/L (ref 19–32)
CREATININE: 0.7 mg/dL (ref 0.50–1.10)
Calcium: 8.9 mg/dL (ref 8.4–10.5)
GFR calc non Af Amer: >90 mL/min (ref >90)
GLUCOSE: 382 mg/dL — AB (ref 70–99)
Potassium: 3.5 mEq/L — ABNORMAL LOW (ref 3.7–5.3)
Sodium: 136 mEq/L — ABNORMAL LOW (ref 137–147)
Total Protein: 6.8 g/dL (ref 6.0–8.3)

## 2014-04-10 LAB — MRSA PCR SCREENING: MRSA by PCR: NEGATIVE

## 2014-04-10 LAB — CBC
HCT: 36.7 % (ref 36.0–46.0)
HCT: 37.2 % (ref 36.0–46.0)
HEMOGLOBIN: 12.2 g/dL (ref 12.0–15.0)
Hemoglobin: 12.3 g/dL (ref 12.0–15.0)
MCH: 31 pg (ref 26.0–34.0)
MCH: 31.1 pg (ref 26.0–34.0)
MCHC: 32.8 g/dL (ref 30.0–36.0)
MCHC: 33.5 g/dL (ref 30.0–36.0)
MCV: 92.9 fL (ref 78.0–100.0)
MCV: 94.7 fL (ref 78.0–100.0)
PLATELETS: 294 10*3/uL (ref 150–400)
Platelets: 324 10*3/uL (ref 150–400)
RBC: 3.93 MIL/uL (ref 3.87–5.11)
RBC: 3.95 MIL/uL (ref 3.87–5.11)
RDW: 13.2 % (ref 11.5–15.5)
RDW: 13.2 % (ref 11.5–15.5)
WBC: 6.3 10*3/uL (ref 4.0–10.5)
WBC: 6.7 10*3/uL (ref 4.0–10.5)

## 2014-04-10 LAB — GLUCOSE, CAPILLARY
GLUCOSE-CAPILLARY: 223 mg/dL — AB (ref 70–99)
GLUCOSE-CAPILLARY: 166 mg/dL — AB (ref 70–99)
Glucose-Capillary: 156 mg/dL — ABNORMAL HIGH (ref 70–99)
Glucose-Capillary: 111 mg/dL — ABNORMAL HIGH (ref 70–99)
Glucose-Capillary: 110 mg/dL — ABNORMAL HIGH (ref 70–99)

## 2014-04-10 LAB — BASIC METABOLIC PANEL
BUN: 10 mg/dL (ref 6–23)
CALCIUM: 8.7 mg/dL (ref 8.4–10.5)
CO2: 27 mEq/L (ref 19–32)
Chloride: 102 mEq/L (ref 96–112)
Creatinine, Ser: 0.57 mg/dL (ref 0.50–1.10)
Glucose, Bld: 210 mg/dL — ABNORMAL HIGH (ref 70–99)
Potassium: 3.4 mEq/L — ABNORMAL LOW (ref 3.7–5.3)
Sodium: 139 mEq/L (ref 137–147)

## 2014-04-10 LAB — ETHANOL

## 2014-04-10 LAB — RAPID URINE DRUG SCREEN, HOSP PERFORMED
Amphetamines: NOT DETECTED
BARBITURATES: NOT DETECTED
Benzodiazepines: NOT DETECTED
COCAINE: NOT DETECTED
Opiates: NOT DETECTED
Tetrahydrocannabinol: NOT DETECTED

## 2014-04-10 LAB — HEMOGLOBIN A1C
Hgb A1c MFr Bld: 9.3 % — ABNORMAL HIGH (ref <5.7)
MEAN PLASMA GLUCOSE: 220 mg/dL — AB (ref <117)

## 2014-04-10 LAB — POC URINE PREG, ED: PREG TEST UR: NEGATIVE

## 2014-04-10 LAB — SALICYLATE LEVEL: Salicylate Lvl: <2 mg/dL — ABNORMAL LOW (ref 2.8–20.0)

## 2014-04-10 MED ORDER — ACTIDOSE WITH SORBITOL 50 GM/240ML PO LIQD
50.0000 g | Freq: Once | ORAL | Status: DC
  Filled 2014-04-10: qty 240

## 2014-04-10 MED ORDER — POTASSIUM CHLORIDE 10 MEQ/100ML IV SOLN
10.0000 meq | INTRAVENOUS | Status: AC
  Administered 2014-04-10 (×3): 10 meq via INTRAVENOUS
  Filled 2014-04-10 (×3): qty 100

## 2014-04-10 MED ORDER — SODIUM CHLORIDE 0.9 % IV SOLN
INTRAVENOUS | Status: DC
  Administered 2014-04-10 – 2014-04-11 (×4): via INTRAVENOUS

## 2014-04-10 MED ORDER — ACETAMINOPHEN 650 MG RE SUPP: 650.0000 mg | Freq: Four times a day (QID) | RECTAL | Status: DC | PRN

## 2014-04-10 MED ORDER — ENOXAPARIN SODIUM 60 MG/0.6ML ~~LOC~~ SOLN
50.0000 mg | SUBCUTANEOUS | Status: DC
  Administered 2014-04-10 – 2014-04-11 (×2): 50 mg via SUBCUTANEOUS
  Filled 2014-04-10 (×2): qty 0.6

## 2014-04-10 MED ORDER — ACETAMINOPHEN 325 MG PO TABS: 650.0000 mg | ORAL_TABLET | Freq: Four times a day (QID) | ORAL | Status: DC | PRN

## 2014-04-10 MED ORDER — INSULIN ASPART 100 UNIT/ML ~~LOC~~ SOLN
0.0000 [IU] | SUBCUTANEOUS | Status: DC
  Administered 2014-04-10: 3 [IU] via SUBCUTANEOUS

## 2014-04-10 MED ORDER — SODIUM CHLORIDE 0.9 % IV BOLUS (SEPSIS)
1000.0000 mL | Freq: Once | INTRAVENOUS | Status: AC
  Administered 2014-04-10: 1000 mL via INTRAVENOUS

## 2014-04-10 MED ORDER — HYDROMORPHONE HCL PF 1 MG/ML IJ SOLN: 0.5000 mg | INTRAMUSCULAR | Status: DC | PRN

## 2014-04-10 MED ORDER — ONDANSETRON HCL 4 MG/2ML IJ SOLN: 4.0000 mg | Freq: Four times a day (QID) | INTRAMUSCULAR | Status: DC | PRN

## 2014-04-10 MED ORDER — PANTOPRAZOLE SODIUM 40 MG IV SOLR
40.0000 mg | INTRAVENOUS | Status: DC
  Administered 2014-04-10: 40 mg via INTRAVENOUS
  Filled 2014-04-10: qty 40

## 2014-04-10 MED ORDER — ENOXAPARIN SODIUM 40 MG/0.4ML ~~LOC~~ SOLN: 40.0000 mg | SUBCUTANEOUS | Status: DC

## 2014-04-10 MED ORDER — INSULIN ASPART 100 UNIT/ML ~~LOC~~ SOLN: 0.0000 [IU] | Freq: Three times a day (TID) | SUBCUTANEOUS | Status: DC

## 2014-04-10 MED ORDER — ALUM & MAG HYDROXIDE-SIMETH 200-200-20 MG/5ML PO SUSP: 30.0000 mL | Freq: Four times a day (QID) | ORAL | Status: DC | PRN

## 2014-04-10 MED ORDER — PNEUMOCOCCAL VAC POLYVALENT 25 MCG/0.5ML IJ INJ
0.5000 mL | INJECTION | INTRAMUSCULAR | Status: DC
  Filled 2014-04-10 (×2): qty 0.5

## 2014-04-10 MED ORDER — OXYCODONE HCL 5 MG PO TABS: 5.0000 mg | ORAL_TABLET | ORAL | Status: DC | PRN

## 2014-04-10 MED ORDER — ONDANSETRON HCL 4 MG PO TABS: 4.0000 mg | ORAL_TABLET | Freq: Four times a day (QID) | ORAL | Status: DC | PRN

## 2014-04-10 NOTE — H&P (Signed)
[facility] History and Physical  Terri Webster WUJ:[id number] DOB: 30-Sep-1966 DOA: 04/10/2014  Referring physician: EDP PCP: No primary provider on file.  Specialists:   Chief Complaint: Overdose  HPI: Terri Webster is a 48 y.o. female with a history of IDDM, HTN and Anxiety who was being taken from the ED after discharge to a homeless shelter when she told the police officers she had taken a handful of her medications.  She was evaluated in the ED and pill counts of her medication bottles which were filled 2 days ago revealed that she had taken 30 HCTZ tablets, 18 Hydroxyzine tablets, and 11 Risperdone tablets in an attempt to kill herself.   She has had unfortunate life circumstances and had become homeless and her children have been taken away from her.  She reports increased anxiety and feelings of helplessness, and reports trying to end it all.    She had ingested the medications 2 hours before arrival back to the ED, and Poison control was contacted and recommended cardiovascular and neurologic monitoring.   She was referred for medical admission.   She was hemodynamically stable and obtunded but arousable at the time of interview.      Review of Systems: Unable to Obtain from the Patient  Past Medical History  Diagnosis Date  . Diabetes mellitus without complication   . Hypertension   . GERD (gastroesophageal reflux disease)   . Anxiety     History reviewed. No pertinent past surgical history.    Prior to Admission medications   Medication Sig Start Date End Date Taking? Authorizing Provider  gabapentin (NEURONTIN) 300 MG capsule Take 300 mg by mouth 3 (three) times daily.   Yes Historical Provider, MD  hydrochlorothiazide (HYDRODIURIL) 25 MG tablet Take 25 mg by mouth daily.   Yes Historical Provider, MD  ibuprofen (ADVIL,MOTRIN) 800 MG tablet Take 800 mg by mouth every 8 (eight) hours as needed for moderate pain.   Yes Historical Provider, MD  insulin detemir (LEVEMIR)  100 UNIT/ML injection Inject 35 Units into the skin at bedtime.   Yes Historical Provider, MD  metFORMIN (GLUCOPHAGE) 500 MG tablet Take 1,000 mg by mouth daily with breakfast.   Yes Historical Provider, MD  ranitidine (ZANTAC) 150 MG tablet Take 150 mg by mouth 2 (two) times daily.   Yes Historical Provider, MD  risperiDONE (RISPERDAL) 2 MG tablet Take 2 mg by mouth at bedtime.   Yes Historical Provider, MD  traMADol (ULTRAM) 50 MG tablet Take 1 tablet (50 mg total) by mouth every 6 (six) hours as needed. 04/09/14  Yes Jamesetta Orleans Lawyer, PA-C    No Known Allergies   Social History:  reports that she has been smoking.  She does not have any smokeless tobacco history on file. She reports that she does not drink alcohol or use illicit drugs.     No family history on file.    Physical Exam:  GEN:  Obtunded Morbidly Obese 48 y.o. African American female examined  and in no acute distress; cooperative with exam Filed Vitals:   04/10/14 0300 04/10/14 0315 04/10/14 0330 04/10/14 0400  BP: 112/71  116/75 115/63  Pulse: 81 72 72 86  Temp:      TempSrc:      Resp: 13 23 13 13   SpO2: 97% 99% 99% 97%   Blood pressure 115/63, pulse 86, temperature 98.1 F (36.7 C), temperature source Oral, resp. rate 13, SpO2 97.00%. PSYCH: She is alert and oriented x 0;  does not appear anxious does not appear depressed; affect is normal HEENT: Normocephalic and Atraumatic, Mucous membranes pink; PERRLA; EOM intact; Fundi:  Benign;  No scleral icterus, Nares: Patent, Oropharynx: Clear, Fair Dentition, Neck:  FROM, no cervical lymphadenopathy nor thyromegaly or carotid bruit; no JVD; Breasts:: Not examined CHEST WALL: No tenderness CHEST: Normal respiration, clear to auscultation bilaterally HEART: Regular rate and rhythm; no murmurs rubs or gallops BACK: No kyphosis or scoliosis; no CVA tenderness ABDOMEN: Positive Bowel Sounds, Obese, soft non-tender; no masses, no organomegaly, no pannus; no intertriginous  candida. Rectal Exam: Not done EXTREMITIES: No cyanosis, clubbing or edema; no ulcerations. Genitalia: not examined PULSES: 2+ and symmetric SKIN: Normal hydration no rash or ulceration CNS:  Obtunded but Arousable,  Responding to simple commands, and Able to move all 4 Extremities Vascular: 2+ pulses palpable throughout    Labs on Admission:  Basic Metabolic Panel:  Recent Labs Lab 04/09/14 0126 04/10/14 0041  NA 138 136*  K 3.5* 3.5*  CL 99 97  CO2 26 26  GLUCOSE 248* 382*  BUN 18 14  CREATININE 0.64 0.70  CALCIUM 9.0 8.9   Liver Function Tests:  Recent Labs Lab 04/09/14 0126 04/10/14 0041  AST 10 10  ALT 13 13  ALKPHOS 75 79  BILITOT 0.3 0.2*  PROT 7.0 6.8  ALBUMIN 3.7 3.6    Recent Labs Lab 04/09/14 0126  LIPASE 38   No results found for this basename: AMMONIA,  in the last 168 hours CBC:  Recent Labs Lab 04/09/14 0126 04/10/14 0041  WBC 7.1 6.7  NEUTROABS 3.8  --   HGB 13.0 12.3  HCT 37.9 36.7  MCV 93.1 92.9  PLT 345 324   Cardiac Enzymes:  Recent Labs Lab 04/09/14 0126  TROPONINI <0.30    BNP (last 3 results) No results found for this basename: PROBNP,  in the last 8760 hours CBG: No results found for this basename: GLUCAP,  in the last 168 hours  Radiological Exams on Admission: Ct Abdomen Pelvis W Contrast  04/09/2014   CLINICAL DATA:  Abdominal pain.  EXAM: CT ABDOMEN AND PELVIS WITH CONTRAST  TECHNIQUE: Multidetector CT imaging of the abdomen and pelvis was performed using the standard protocol following bolus administration of intravenous contrast.  CONTRAST:  50mL OMNIPAQUE IOHEXOL 300 MG/ML SOLN, OMNIPAQUE IOHEXOL 300 MG/ML SOLN  COMPARISON:  11/11/2003.  FINDINGS: The lung bases are clear except for streaky areas of atelectasis and scarring. Heart is normal in size. No pericardial effusion.  The liver is unremarkable. Multiple small gallstones are noted the gallbladder. No common bile duct dilatation. The pancreas appears  normal. The spleen is normal in size no focal lesions. The adrenal glands and kidneys are unremarkable.  The stomach, duodenum, small bowel and colon are grossly normal. No inflammatory changes, mass lesions or obstructive findings. The appendix is normal. No mesenteric or retroperitoneal mass or adenopathy. The aorta and branch vessels are patent.  The uterus contains an IUD. There is a calcified uterine fibroid noted. Tubal ligation clips are present. The ovaries are normal. The bladder is normal. No pelvic mass, adenopathy or free pelvic fluid collections. No inguinal mass or adenopathy.  The bony structures are unremarkable.  IMPRESSION: No acute abdominal/ pelvic findings, mass lesions or adenopathy.  Chololithiasis.   Electronically Signed   By: Loralie Champagne M.D.   On: 04/09/2014 04:00      EKG: Independently reviewed. Normal Sinus Rhythm without Acute S-T changes.  Assessment/Plan:   48 y.o. female with  Principal Problem:   Overdose Active Problems:   Drug overdose, multiple drugs   Hypertension   Diabetes mellitus     1.  Overdose- on Multiple Medicationss, Admit To Stepdown Bed, Suicide Precautions, and 1:1 Sitter until evaluation by Psychiatry when Medically clear,     Poison Control contacted and recomendations to monitor for Hypotension, QT prolongation, Electrolyte Imbalances, and Seizures.  NPO while Obtunded.    2.  HTN-  Hx, BP meds on hold.     3.  Diabetes Mellitus-  SSI coverage PRN, and monitor for Hypoglycemia.    4.  DVT Prophylaxis with Lovenox.      Code Status:   FULL CODE Family Communication:    No Family present Disposition Plan:    Inpatient to Stepdown Bed     Time spent:  60 Minutes  Maurine Mowbray Velora HecklerC Seger Jani [facility] Pager [phone]  If 7PM-7AM, please contact night-coverage [email or web address] Password [email or web address] 04/10/2014, 4:53 AM

## 2014-04-10 NOTE — ED Provider Notes (Signed)
Medical screening examination/treatment/procedure(s) were performed by non-physician practitioner and as supervising physician I was immediately available for consultation/collaboration.   EKG Interpretation None       Hurman Horn, MD 04/10/14 1451

## 2014-04-10 NOTE — ED Notes (Signed)
This Clinical research associate spoke with [employer], pt should receive 1gm/kg of Activated charcoal ( this will not be given because of risk of aspiration)pt is too sleepy.   Do supportive care, ekg, cardiac montoring,4 hr, tylenol level, CNS  Depressant , increased urination,  Electrolyte imbalance,  Anticholinergic effect.

## 2014-04-10 NOTE — ED Provider Notes (Signed)
CSN: [id number]     Arrival date & time 04/09/14  2355 History   First MD Initiated Contact with Patient 04/10/14 0016     Chief Complaint  Patient presents with  . Medical Clearance  . Drug Overdose     (Consider location/radiation/quality/duration/timing/severity/associated sxs/prior Treatment) HPI 48 year old female presents to emergency department with complaint of overdose.  Patient is very somnolent, difficult to get history from.  She reports she took an overdose of pills at the same time she was given Ultram at an earlier visit to the ED today.  Per charts, this was at 2244 this evening.  Patient reports she took all of her hydrochlorothiazide.  Per pill bottle this is a 30 tabs bottle filled 2 days ago.  She also took a handful of risperidone and hydroxyzine.  Pill counts show up to 13 risperidone and 11 hydroxyzine.  Overdose was roughly 2 hours ago.  Patient again is very somnolent and cannot give further history.  Patient was being escorted from the hospital to a homeless shelter by [facility] when she admitted to taking the overdose and requested to be returned to the emergency department.  Patient reports that she is hopeless.  She denies previous suicide attempts.  She reports that she was IVC at once for 2 to lies her sister told.  This is the patient's fourth visit to the ED in 24 hours.  Her first visit earlier this morning with complaint of anxiety, abdominal pain, leg pain.  No acute pathology was noted.  Patient was anxious but not suicidal at that time.  She returned in the afternoon with complaint of depression, had paranoid delusions at this time.  Patient was medically cleared and decided she wanted to leave.  She was seen by Dr. Lestine Box who felt that she was safe for discharge.  Patient then returned a few hours later complaining of left leg pain.  Per nursing notes, patient had been in the waiting room in between her second and third visit and was complaining of people talking about  her in the lobby and being very paranoid.  Patient was given Ultram for leg pain, and discharged to the homeless shelter. Past Medical History  Diagnosis Date  . Diabetes mellitus without complication   . Hypertension   . GERD (gastroesophageal reflux disease)   . Anxiety    History reviewed. No pertinent past surgical history. No family history on file. History  Substance Use Topics  . Smoking status: Current Every Day Smoker  . Smokeless tobacco: Not on file  . Alcohol Use: No   OB History   Grav Para Term Preterm Abortions TAB SAB Ect Mult Living                 Review of Systems  Unable to perform ROS: Mental status change      Allergies  Review of patient's allergies indicates no known allergies.  Home Medications   Prior to Admission medications   Medication Sig Start Date End Date Taking? Authorizing Provider  gabapentin (NEURONTIN) 300 MG capsule Take 300 mg by mouth 3 (three) times daily.    Historical Provider, MD  hydrochlorothiazide (HYDRODIURIL) 25 MG tablet Take 25 mg by mouth daily.    Historical Provider, MD  ibuprofen (ADVIL,MOTRIN) 800 MG tablet Take 800 mg by mouth every 8 (eight) hours as needed for moderate pain.    Historical Provider, MD  insulin detemir (LEVEMIR) 100 UNIT/ML injection Inject 35 Units into the skin at bedtime.  Historical Provider, MD  metFORMIN (GLUCOPHAGE) 500 MG tablet Take 1,000 mg by mouth daily with breakfast.    Historical Provider, MD  ranitidine (ZANTAC) 150 MG tablet Take 150 mg by mouth 2 (two) times daily.    Historical Provider, MD  risperiDONE (RISPERDAL) 2 MG tablet Take 2 mg by mouth at bedtime.    Historical Provider, MD  traMADol (ULTRAM) 50 MG tablet Take 1 tablet (50 mg total) by mouth every 6 (six) hours as needed. 04/09/14   Jamesetta Orleans Lawyer, PA-C   BP 129/62  Pulse 97  Temp(Src) 98.1 F (36.7 C) (Oral)  Resp 18  SpO2 98% Physical Exam  Nursing note and vitals reviewed. Constitutional: She appears  well-developed and well-nourished.  HENT:  Head: Normocephalic and atraumatic.  Right Ear: External ear normal.  Left Ear: External ear normal.  Nose: Nose normal.  Mouth/Throat: Oropharynx is clear and moist.  Eyes: Conjunctivae and EOM are normal.   Pinpoint pupils   Neck: Normal range of motion. Neck supple. No JVD present. No tracheal deviation present. No thyromegaly present.  Cardiovascular: Normal rate, regular rhythm, normal heart sounds and intact distal pulses.  Exam reveals no gallop and no friction rub.   No murmur heard. Pulmonary/Chest: Effort normal and breath sounds normal. No stridor. No respiratory distress. She has no wheezes. She has no rales. She exhibits no tenderness.  Abdominal: Soft. Bowel sounds are normal. She exhibits no distension and no mass. There is no tenderness. There is no rebound and no guarding.  Musculoskeletal: Normal range of motion. She exhibits no edema and no tenderness.  Lymphadenopathy:    She has no cervical adenopathy.  Neurological:  Patient is somnolent but arousable.  Patient quickly falls back to sleep  Skin: Skin is warm and dry. No rash noted. No erythema. No pallor.  Psychiatric:  Flat affect, suicidal when she is awake.    ED Course  Procedures (including critical care time) Labs Review Labs Reviewed  COMPREHENSIVE METABOLIC PANEL - Abnormal; Notable for the following:    Sodium 136 (*)    Potassium 3.5 (*)    Glucose, Bld 382 (*)    Total Bilirubin 0.2 (*)    All other components within normal limits  SALICYLATE LEVEL - Abnormal; Notable for the following:    Salicylate Lvl <2.0 (*)    All other components within normal limits  CBC  ETHANOL  ACETAMINOPHEN LEVEL  URINE RAPID DRUG SCREEN (HOSP PERFORMED)  ACETAMINOPHEN LEVEL  POC URINE PREG, ED    Imaging Review Ct Abdomen Pelvis W Contrast  04/09/2014   CLINICAL DATA:  Abdominal pain.  EXAM: CT ABDOMEN AND PELVIS WITH CONTRAST  TECHNIQUE: Multidetector CT imaging of  the abdomen and pelvis was performed using the standard protocol following bolus administration of intravenous contrast.  CONTRAST:  68mL OMNIPAQUE IOHEXOL 300 MG/ML SOLN, OMNIPAQUE IOHEXOL 300 MG/ML SOLN  COMPARISON:  11/11/2003.  FINDINGS: The lung bases are clear except for streaky areas of atelectasis and scarring. Heart is normal in size. No pericardial effusion.  The liver is unremarkable. Multiple small gallstones are noted the gallbladder. No common bile duct dilatation. The pancreas appears normal. The spleen is normal in size no focal lesions. The adrenal glands and kidneys are unremarkable.  The stomach, duodenum, small bowel and colon are grossly normal. No inflammatory changes, mass lesions or obstructive findings. The appendix is normal. No mesenteric or retroperitoneal mass or adenopathy. The aorta and branch vessels are patent.  The uterus contains  an IUD. There is a calcified uterine fibroid noted. Tubal ligation clips are present. The ovaries are normal. The bladder is normal. No pelvic mass, adenopathy or free pelvic fluid collections. No inguinal mass or adenopathy.  The bony structures are unremarkable.  IMPRESSION: No acute abdominal/ pelvic findings, mass lesions or adenopathy.  Chololithiasis.   Electronically Signed   By: Loralie ChampagneMark  Gallerani M.D.   On: 04/09/2014 04:00     EKG Interpretation None      Date: 04/10/2014  Rate: 81  Rhythm: normal sinus rhythm  QRS Axis: normal  Intervals: normal  ST/T Wave abnormalities: normal  Conduction Disutrbances:none  Narrative Interpretation: QTC 450  Old EKG Reviewed: none available   MDM   Final diagnoses:  Drug overdose, intentional  Suicide attempt    48 year old female with overdose of hydrochlorothiazide hydroxyzine and risperidone.  Patient is very somnolent, but at this time she is still protecting her airway.  Will be closely monitored for worsening mental status and need for intubation.  Given her overdose of  possibly 30 tablets of hydrochlorothiazide, don't feel that she will be able to be medically cleared from the emergency department.  We'll get labs, monitor here for worsening mental status.  Expect she'll need to be admitted either to the hospital service or critical care.  4:32 AM Pt's 4 hour tylenol level is 0.  She remains somnolent but is protecting her airway and will waken with stimulation.  To be admitted to the stepdown unit.    Olivia Mackielga M Lyris Hitchman, MD 04/10/14 [phone]

## 2014-04-10 NOTE — ED Notes (Signed)
Pt has one black duffel bag, one polka dot bag, and one white personal belongings bag. Pt was changed into a gown in the room and had a pair of tennis shoes, a pair of socks, a t-shirt, a pair of underwear, and a Advertising account planner.

## 2014-04-10 NOTE — Consult Note (Signed)
[facility] Consult   Reason for Consult:  Suicidal attempt Referring Physician:  Dr. Enis Slipper Terri Webster is an 48 y.o. female. Total Time spent with patient: 45 minutes  Assessment: AXIS I:  Major Depression, Recurrent severe AXIS II:  Deferred AXIS III:   Past Medical History  Diagnosis Date  . Diabetes mellitus without complication   . Hypertension   . GERD (gastroesophageal reflux disease)   . Anxiety    AXIS IV:  other psychosocial or environmental problems, problems related to social environment and problems with primary support group AXIS V:  41-50 serious symptoms  Plan:  Recommend psychiatric Inpatient admission when medically cleared. Supportive therapy provided about ongoing stressors. Recommended no psychiatric medications at this time The patient psychiatric consultation Followup as clinically required and may contact [phone] if needed further assistance  Subjective:   Terri Webster is a 48 y.o. female patient admitted with suicidal attempt with medications.  HPI:  Patient was seen face-to-face for psychiatric evaluation examination. Patient was unable to provide linear and goal-directed history secondary to being on sedation. Patient was endorsing symptoms of depression and suicide attempt with the overdose on her medication. Patient will be followed by psychiatry consultation when patient is less sedated unable to provide more history. Patient blood alcohol level is less than 11 and urine drug screen was negative for drug of Abuse. Will recommend inpatient psychiatric hospitalization for crisis stabilization, safety margin and medication management of depression with suicide attempt.  Medical history: Terri Webster is a 48 y.o. female with a history of IDDM, HTN and Anxiety who was being taken from the ED after discharge to a homeless shelter when she told the police officers she had taken a handful of her medications. She was evaluated in the ED and  pill counts of her medication bottles which were filled 2 days ago revealed that she had taken 30 HCTZ tablets, 18 Hydroxyzine tablets, and 11 Risperdone tablets in an attempt to kill herself. She has had unfortunate life circumstances and had become homeless and her children have been taken away from her. She reports increased anxiety and feelings of helplessness, and reports trying to end it all. She had ingested the medications 2 hours before arrival back to the ED, and Poison control was contacted and recommended cardiovascular and neurologic monitoring. She was referred for medical admission. She was hemodynamically stable and obtunded but arousable at the time of interview.  Review of Systems: Unable to Obtain from the Patient  HPI Elements:   Location:  depression and overdose. Quality:  acute. Severity:  suicidal attempt. Timing:  multiple psychosocial stresses.  Past Psychiatric History: Past Medical History  Diagnosis Date  . Diabetes mellitus without complication   . Hypertension   . GERD (gastroesophageal reflux disease)   . Anxiety     reports that she has been smoking.  She does not have any smokeless tobacco history on file. She reports that she does not drink alcohol or use illicit drugs. No family history on file.         Allergies:  No Known Allergies  ACT Assessment Complete:  NO Objective: Blood pressure 133/62, pulse 64, temperature 97.6 F (36.4 C), temperature source Axillary, resp. rate 13, height _0  (1.626 m), weight 94.5 kg (208 lb 5.4 oz), SpO2 99.00%.Body mass index is 35.74 kg/(m^2). Results for orders placed during the hospital encounter of 04/10/14 (from the past 72 hour(s))  CBC     Status: None  Collection Time    04/10/14 12:41 AM      Result Value Ref Range   WBC 6.7  4.0 - 10.5 K/uL   RBC 3.95  3.87 - 5.11 MIL/uL   Hemoglobin 12.3  12.0 - 15.0 g/dL   HCT 36.7  36.0 - 46.0 %   MCV 92.9  78.0 - 100.0 fL   MCH 31.1  26.0 - 34.0 pg   MCHC  33.5  30.0 - 36.0 g/dL   RDW 13.2  11.5 - 15.5 %   Platelets 324  150 - 400 K/uL  COMPREHENSIVE METABOLIC PANEL     Status: Abnormal   Collection Time    04/10/14 12:41 AM      Result Value Ref Range   Sodium 136 (*) 137 - 147 mEq/L   Potassium 3.5 (*) 3.7 - 5.3 mEq/L   Chloride 97  96 - 112 mEq/L   CO2 26  19 - 32 mEq/L   Glucose, Bld 382 (*) 70 - 99 mg/dL   BUN 14  6 - 23 mg/dL   Creatinine, Ser 0.70  0.50 - 1.10 mg/dL   Calcium 8.9  8.4 - 10.5 mg/dL   Total Protein 6.8  6.0 - 8.3 g/dL   Albumin 3.6  3.5 - 5.2 g/dL   AST 10  0 - 37 U/L   ALT 13  0 - 35 U/L   Alkaline Phosphatase 79  39 - 117 U/L   Total Bilirubin 0.2 (*) 0.3 - 1.2 mg/dL   GFR calc non Af Amer >90  >90 mL/min   GFR calc Af Amer >90  >90 mL/min   Comment: (NOTE)     The eGFR has been calculated using the CKD EPI equation.     This calculation has not been validated in all clinical situations.     eGFR's persistently <90 mL/min signify possible Chronic Kidney     Disease.  ETHANOL     Status: None   Collection Time    04/10/14 12:41 AM      Result Value Ref Range   Alcohol, Ethyl (B) <11  0 - 11 mg/dL   Comment:            LOWEST DETECTABLE LIMIT FOR     SERUM ALCOHOL IS 11 mg/dL     FOR MEDICAL PURPOSES ONLY  ACETAMINOPHEN LEVEL     Status: None   Collection Time    04/10/14 12:41 AM      Result Value Ref Range   Acetaminophen (Tylenol), Serum <15.0  10 - 30 ug/mL   Comment:            THERAPEUTIC CONCENTRATIONS VARY     SIGNIFICANTLY. A RANGE OF 10-30     ug/mL MAY BE AN EFFECTIVE     CONCENTRATION FOR MANY PATIENTS.     HOWEVER, SOME ARE BEST TREATED     AT CONCENTRATIONS OUTSIDE THIS     RANGE.     ACETAMINOPHEN CONCENTRATIONS     >150 ug/mL AT 4 HOURS AFTER     INGESTION AND >50 ug/mL AT 12     HOURS AFTER INGESTION ARE     OFTEN ASSOCIATED WITH TOXIC     REACTIONS.  SALICYLATE LEVEL     Status: Abnormal   Collection Time    04/10/14 12:41 AM      Result Value Ref Range   Salicylate Lvl  <0.1 (*) 2.8 - 20.0 mg/dL  URINE RAPID DRUG SCREEN (HOSP PERFORMED)  Status: None   Collection Time    04/10/14  1:09 AM      Result Value Ref Range   Opiates NONE DETECTED  NONE DETECTED   Comment: DELTA CHECK NOTED   Cocaine NONE DETECTED  NONE DETECTED   Benzodiazepines NONE DETECTED  NONE DETECTED   Amphetamines NONE DETECTED  NONE DETECTED   Comment: DELTA CHECK NOTED   Tetrahydrocannabinol NONE DETECTED  NONE DETECTED   Barbiturates NONE DETECTED  NONE DETECTED   Comment:            DRUG SCREEN FOR MEDICAL PURPOSES     ONLY.  IF CONFIRMATION IS NEEDED     FOR ANY PURPOSE, NOTIFY LAB     WITHIN 5 DAYS.                LOWEST DETECTABLE LIMITS     FOR URINE DRUG SCREEN     Drug Class       Cutoff (ng/mL)     Amphetamine      1000     Barbiturate      200     Benzodiazepine   309     Tricyclics       407     Opiates          300     Cocaine          300     THC              50  POC URINE PREG, ED     Status: None   Collection Time    04/10/14  1:17 AM      Result Value Ref Range   Preg Test, Ur NEGATIVE  NEGATIVE   Comment:            THE SENSITIVITY OF THIS     METHODOLOGY IS >24 mIU/mL  ACETAMINOPHEN LEVEL     Status: None   Collection Time    04/10/14  3:45 AM      Result Value Ref Range   Acetaminophen (Tylenol), Serum <15.0  10 - 30 ug/mL   Comment:            THERAPEUTIC CONCENTRATIONS VARY     SIGNIFICANTLY. A RANGE OF 10-30     ug/mL MAY BE AN EFFECTIVE     CONCENTRATION FOR MANY PATIENTS.     HOWEVER, SOME ARE BEST TREATED     AT CONCENTRATIONS OUTSIDE THIS     RANGE.     ACETAMINOPHEN CONCENTRATIONS     >150 ug/mL AT 4 HOURS AFTER     INGESTION AND >50 ug/mL AT 12     HOURS AFTER INGESTION ARE     OFTEN ASSOCIATED WITH TOXIC     REACTIONS.  GLUCOSE, CAPILLARY     Status: Abnormal   Collection Time    04/10/14  6:21 AM      Result Value Ref Range   Glucose-Capillary 223 (*) 70 - 99 mg/dL  MRSA PCR SCREENING     Status: None   Collection Time     04/10/14  6:33 AM      Result Value Ref Range   MRSA by PCR NEGATIVE  NEGATIVE   Comment:            The GeneXpert MRSA Assay (FDA     approved for NASAL specimens     only), is one component of a     comprehensive MRSA colonization  surveillance program. It is not     intended to diagnose MRSA     infection nor to guide or     monitor treatment for     MRSA infections.  BASIC METABOLIC PANEL     Status: Abnormal   Collection Time    04/10/14  7:40 AM      Result Value Ref Range   Sodium 139  137 - 147 mEq/L   Potassium 3.4 (*) 3.7 - 5.3 mEq/L   Chloride 102  96 - 112 mEq/L   CO2 27  19 - 32 mEq/L   Glucose, Bld 210 (*) 70 - 99 mg/dL   BUN 10  6 - 23 mg/dL   Creatinine, Ser 0.57  0.50 - 1.10 mg/dL   Calcium 8.7  8.4 - 10.5 mg/dL   GFR calc non Af Amer >90  >90 mL/min   GFR calc Af Amer >90  >90 mL/min   Comment: (NOTE)     The eGFR has been calculated using the CKD EPI equation.     This calculation has not been validated in all clinical situations.     eGFR's persistently <90 mL/min signify possible Chronic Kidney     Disease.  CBC     Status: None   Collection Time    04/10/14  7:40 AM      Result Value Ref Range   WBC 6.3  4.0 - 10.5 K/uL   RBC 3.93  3.87 - 5.11 MIL/uL   Hemoglobin 12.2  12.0 - 15.0 g/dL   HCT 37.2  36.0 - 46.0 %   MCV 94.7  78.0 - 100.0 fL   MCH 31.0  26.0 - 34.0 pg   MCHC 32.8  30.0 - 36.0 g/dL   RDW 13.2  11.5 - 15.5 %   Platelets 294  150 - 400 K/uL  GLUCOSE, CAPILLARY     Status: Abnormal   Collection Time    04/10/14  8:25 AM      Result Value Ref Range   Glucose-Capillary 166 (*) 70 - 99 mg/dL   Labs are reviewed and are pertinent for .  Current Facility-Administered Medications  Medication Dose Route Frequency Provider Last Rate Last Dose  . 0.9 %  sodium chloride infusion   Intravenous Continuous Theressa Millard, MD 125 mL/hr at 04/10/14 [id number]    . acetaminophen (TYLENOL) tablet 650 mg  650 mg Oral Q6H PRN Theressa Millard, MD       Or  . acetaminophen (TYLENOL) suppository 650 mg  650 mg Rectal Q6H PRN Theressa Millard, MD      . alum & mag hydroxide-simeth (MAALOX/MYLANTA) 200-200-20 MG/5ML suspension 30 mL  30 mL Oral Q6H PRN Theressa Millard, MD      . enoxaparin (LOVENOX) injection 50 mg  50 mg Subcutaneous Q24H Garnet Sierras, RPH   50 mg at 04/10/14 1100  . HYDROmorphone (DILAUDID) injection 0.5-1 mg  0.5-1 mg Intravenous Q3H PRN Theressa Millard, MD      . insulin aspart (novoLOG) injection 0-15 Units  0-15 Units Subcutaneous 6 times per day Ripudeep Krystal Eaton, MD   3 Units at 04/10/14 0943  . ondansetron (ZOFRAN) tablet 4 mg  4 mg Oral Q6H PRN Theressa Millard, MD       Or  . ondansetron (ZOFRAN) injection 4 mg  4 mg Intravenous Q6H PRN Theressa Millard, MD      . oxyCODONE (Oxy IR/ROXICODONE) immediate release tablet 5  mg  5 mg Oral Q4H PRN Theressa Millard, MD      . pantoprazole (PROTONIX) injection 40 mg  40 mg Intravenous Q24H Theressa Millard, MD   40 mg at 04/10/14 0631    Psychiatric Specialty Exam: Physical Exam  ROS  Blood pressure 133/62, pulse 64, temperature 97.6 F (36.4 C), temperature source Axillary, resp. rate 13, height _0  (1.626 m), weight 94.5 kg (208 lb 5.4 oz), SpO2 99.00%.Body mass index is 35.74 kg/(m^2).  General Appearance: Patient has been sedated, Not appeared to be in distress  Eye Contact::  Minimal  Speech:  Blocked and Slow  Volume:  Decreased  Mood:  Anxious, Depressed, Hopeless and Worthless  Affect:  Depressed and Flat  Thought Process:  Linear  Orientation:  NA  Thought Content:  Rumination  Suicidal Thoughts:  Yes.  with intent/plan  Homicidal Thoughts:  No  Memory:  Immediate;   Fair  Judgement:  Impaired  Insight:  Lacking  Psychomotor Activity:  Psychomotor Retardation  Concentration:  Poor  Recall:  [place]: Fair  Akathisia:  NA  Handed:  Right  AIMS (if indicated):     Assets:  Communication  Skills Leisure Time Resilience  Sleep:      Musculoskeletal: Strength & Muscle Tone: within normal limits Gait & Station: unable to stand Patient leans: N/A  Treatment Plan Summary: Daily contact with patient to assess and evaluate symptoms and progress in treatment Medication management  Durward Parcel 04/10/2014 12:33 PM

## 2014-04-10 NOTE — Progress Notes (Signed)
Patient ID: Terri Webster  female  [id number]    DOB: 1966/07/29    DOA: 04/10/2014  PCP: No primary provider on file.  Assessment/Plan: Principal Problem:   Overdose intentional for suicide attempt: On HCTZ, hydroxyzine, risperidone tablets with suicidal attempt - Patient somewhat somnolent but easily arousable, following verbal commands - Continue IV fluid hydration, symptomatic support - Will start diet once patient is more alert and awake to eat - Continue one-to-one sitter - I have called for psych consultation  Active Problems:   Drug overdose, multiple drugs: As #1    Diabetes mellitus - Continue sliding scale insulin    Hypertension -  Currently stable, continue to monitor, continue IV fluids  Hypokalemia: - Likely due to excessive HCTZ, placed on IV replacement   DVT Prophylaxis: Lovenox  Code Status: Full code  Family Communication:  Disposition:  Consultants:  Psychiatry called today  Procedures:  None  Antibiotics:  None    Subjective: Patient seen and examined, somnolent but easily arousable, following most verbal commands  Objective: Weight change:   Intake/Output Summary (Last 24 hours) at 04/10/14 0820 Last data filed at 04/10/14 0617  Gross per 24 hour  Intake    100 ml  Output      0 ml  Net    100 ml   Blood pressure 130/64, pulse 80, temperature 97.5 F (36.4 C), temperature source Oral, resp. rate 13, height 5\' 4"  (1.626 m), weight 94.5 kg (208 lb 5.4 oz), SpO2 98.00%.  Physical Exam: General: Somnolent but easily arousable CVS: S1-S2 clear, no murmur rubs or gallops Chest: clear to auscultation bilaterally, no wheezing, rales or rhonchi Abdomen: soft nontender, nondistended, normal bowel sounds  Extremities: no cyanosis, clubbing or edema noted bilaterally Neuro: Moving all 4 extremities spontaneously, able to squeeze my fingers and wiggle her toes but did not follow the cranial nerves exam  Lab Results: Basic  Metabolic Panel:  Recent Labs Lab 04/09/14 0126 04/10/14 0041  NA 138 136*  K 3.5* 3.5*  CL 99 97  CO2 26 26  GLUCOSE 248* 382*  BUN 18 14  CREATININE 0.64 0.70  CALCIUM 9.0 8.9   Liver Function Tests:  Recent Labs Lab 04/09/14 0126 04/10/14 0041  AST 10 10  ALT 13 13  ALKPHOS 75 79  BILITOT 0.3 0.2*  PROT 7.0 6.8  ALBUMIN 3.7 3.6    Recent Labs Lab 04/09/14 0126  LIPASE 38   No results found for this basename: AMMONIA,  in the last 168 hours CBC:  Recent Labs Lab 04/09/14 0126 04/10/14 0041 04/10/14 0740  WBC 7.1 6.7 6.3  NEUTROABS 3.8  --   --   HGB 13.0 12.3 12.2  HCT 37.9 36.7 37.2  MCV 93.1 92.9 94.7  PLT 345 324 294   Cardiac Enzymes:  Recent Labs Lab 04/09/14 0126  TROPONINI <0.30   BNP: No components found with this basename: POCBNP,  CBG:  Recent Labs Lab 04/10/14 0621  GLUCAP 223*     Micro Results: No results found for this or any previous visit (from the past 240 hour(s)).  Studies/Results: Ct Abdomen Pelvis W Contrast  04/09/2014   CLINICAL DATA:  Abdominal pain.  EXAM: CT ABDOMEN AND PELVIS WITH CONTRAST  TECHNIQUE: Multidetector CT imaging of the abdomen and pelvis was performed using the standard protocol following bolus administration of intravenous contrast.  CONTRAST:  54mL OMNIPAQUE IOHEXOL 300 MG/ML SOLN, OMNIPAQUE IOHEXOL 300 MG/ML SOLN  COMPARISON:  11/11/2003.  FINDINGS:  The lung bases are clear except for streaky areas of atelectasis and scarring. Heart is normal in size. No pericardial effusion.  The liver is unremarkable. Multiple small gallstones are noted the gallbladder. No common bile duct dilatation. The pancreas appears normal. The spleen is normal in size no focal lesions. The adrenal glands and kidneys are unremarkable.  The stomach, duodenum, small bowel and colon are grossly normal. No inflammatory changes, mass lesions or obstructive findings. The appendix is normal. No mesenteric or retroperitoneal  mass or adenopathy. The aorta and branch vessels are patent.  The uterus contains an IUD. There is a calcified uterine fibroid noted. Tubal ligation clips are present. The ovaries are normal. The bladder is normal. No pelvic mass, adenopathy or free pelvic fluid collections. No inguinal mass or adenopathy.  The bony structures are unremarkable.  IMPRESSION: No acute abdominal/ pelvic findings, mass lesions or adenopathy.  Chololithiasis.   Electronically Signed   By: Loralie ChampagneMark  Gallerani M.D.   On: 04/09/2014 04:00    Medications: Scheduled Meds: . enoxaparin (LOVENOX) injection  50 mg Subcutaneous Q24H  . insulin aspart  0-9 Units Subcutaneous 6 times per day  . pantoprazole (PROTONIX) IV  40 mg Intravenous Q24H  . potassium chloride  10 mEq Intravenous Q1 Hr x 3      LOS: 0 days   Augustin Bun Jenna LuoK Sulayman Manning M.D. [facility] 04/10/2014, 8:20 AM Pager: [phone]  If 7PM-7AM, please contact night-coverage [email or web address] Password [email or web address]  **Disclaimer: This note was dictated with voice recognition software. Similar sounding words can inadvertently be transcribed and this note may contain transcription errors which may not have been corrected upon publication of note.**

## 2014-04-10 NOTE — ED Notes (Signed)
Spoke with Onalee Hua at [employer] him an update on pt's admission and lab results with vitals

## 2014-04-11 ENCOUNTER — Inpatient Hospital Stay (HOSPITAL_COMMUNITY)
Admission: AD | Admit: 2014-04-11 | Discharge: 2014-04-14 | DRG: 885 | Disposition: A | Discharge status: Home / Self Care | Payer: MEDICAID | Source: Intra-hospital | Attending: Psychiatry | Admitting: Psychiatry

## 2014-04-11 ENCOUNTER — Encounter (HOSPITAL_COMMUNITY): Payer: Self-pay

## 2014-04-11 DIAGNOSIS — F17200 Nicotine dependence, unspecified, uncomplicated: Secondary | ICD-10-CM | POA: Diagnosis present

## 2014-04-11 DIAGNOSIS — T50901A Poisoning by unspecified drugs, medicaments and biological substances, accidental (unintentional), initial encounter: Secondary | ICD-10-CM | POA: Diagnosis not present

## 2014-04-11 DIAGNOSIS — F322 Major depressive disorder, single episode, severe without psychotic features: Secondary | ICD-10-CM | POA: Diagnosis not present

## 2014-04-11 DIAGNOSIS — E43 Unspecified severe protein-calorie malnutrition: Secondary | ICD-10-CM | POA: Insufficient documentation

## 2014-04-11 DIAGNOSIS — I10 Essential (primary) hypertension: Secondary | ICD-10-CM | POA: Diagnosis not present

## 2014-04-11 DIAGNOSIS — E119 Type 2 diabetes mellitus without complications: Secondary | ICD-10-CM | POA: Diagnosis present

## 2014-04-11 DIAGNOSIS — Z598 Other problems related to housing and economic circumstances: Secondary | ICD-10-CM

## 2014-04-11 DIAGNOSIS — T50911A Poisoning by multiple unspecified drugs, medicaments and biological substances, accidental (unintentional), initial encounter: Secondary | ICD-10-CM

## 2014-04-11 DIAGNOSIS — R45851 Suicidal ideations: Secondary | ICD-10-CM

## 2014-04-11 DIAGNOSIS — Z5987 Material hardship due to limited financial resources, not elsewhere classified: Secondary | ICD-10-CM

## 2014-04-11 DIAGNOSIS — Z5900 Homelessness unspecified: Secondary | ICD-10-CM

## 2014-04-11 DIAGNOSIS — F411 Generalized anxiety disorder: Secondary | ICD-10-CM | POA: Diagnosis present

## 2014-04-11 DIAGNOSIS — Z794 Long term (current) use of insulin: Secondary | ICD-10-CM

## 2014-04-11 DIAGNOSIS — F3132 Bipolar disorder, current episode depressed, moderate: Principal | ICD-10-CM | POA: Diagnosis present

## 2014-04-11 DIAGNOSIS — F410 Panic disorder [episodic paroxysmal anxiety] without agoraphobia: Secondary | ICD-10-CM | POA: Diagnosis present

## 2014-04-11 DIAGNOSIS — K219 Gastro-esophageal reflux disease without esophagitis: Secondary | ICD-10-CM | POA: Diagnosis present

## 2014-04-11 DIAGNOSIS — T50902A Poisoning by unspecified drugs, medicaments and biological substances, intentional self-harm, initial encounter: Secondary | ICD-10-CM

## 2014-04-11 HISTORY — DX: Neuralgia and neuritis, unspecified: M79.2

## 2014-04-11 LAB — BASIC METABOLIC PANEL
BUN: 10 mg/dL (ref 6–23)
CALCIUM: 8.8 mg/dL (ref 8.4–10.5)
CO2: 24 mEq/L (ref 19–32)
Chloride: 102 mEq/L (ref 96–112)
Creatinine, Ser: 0.71 mg/dL (ref 0.50–1.10)
Glucose, Bld: 265 mg/dL — ABNORMAL HIGH (ref 70–99)
POTASSIUM: 3.7 meq/L (ref 3.7–5.3)
Sodium: 139 mEq/L (ref 137–147)

## 2014-04-11 LAB — GLUCOSE, CAPILLARY
GLUCOSE-CAPILLARY: 306 mg/dL — AB (ref 70–99)
Glucose-Capillary: 213 mg/dL — ABNORMAL HIGH (ref 70–99)
Glucose-Capillary: 298 mg/dL — ABNORMAL HIGH (ref 70–99)

## 2014-04-11 MED ORDER — PANTOPRAZOLE SODIUM 40 MG PO TBEC
40.0000 mg | DELAYED_RELEASE_TABLET | Freq: Every day | ORAL | Status: DC
  Administered 2014-04-11: 40 mg via ORAL
  Filled 2014-04-11: qty 1

## 2014-04-11 MED ORDER — INSULIN ASPART 100 UNIT/ML ~~LOC~~ SOLN
0.0000 [IU] | Freq: Every day | SUBCUTANEOUS | Status: DC
  Administered 2014-04-11: 4 [IU] via SUBCUTANEOUS
  Administered 2014-04-12: 2 [IU] via SUBCUTANEOUS

## 2014-04-11 MED ORDER — PANTOPRAZOLE SODIUM 40 MG IV SOLR: 40.0000 mg | INTRAVENOUS | Status: DC

## 2014-04-11 MED ORDER — ACETAMINOPHEN 325 MG PO TABS: 650.0000 mg | ORAL_TABLET | Freq: Four times a day (QID) | ORAL | Status: DC | PRN

## 2014-04-11 MED ORDER — INSULIN ASPART 100 UNIT/ML ~~LOC~~ SOLN
0.0000 [IU] | Freq: Three times a day (TID) | SUBCUTANEOUS | Status: DC
  Administered 2014-04-12 (×3): 5 [IU] via SUBCUTANEOUS
  Administered 2014-04-13 (×3): 3 [IU] via SUBCUTANEOUS
  Administered 2014-04-14: 5 [IU] via SUBCUTANEOUS
  Administered 2014-04-14: 3 [IU] via SUBCUTANEOUS

## 2014-04-11 MED ORDER — AMITRIPTYLINE HCL 25 MG PO TABS
25.0000 mg | ORAL_TABLET | Freq: Every day | ORAL | Status: DC
  Filled 2014-04-11 (×5): qty 1

## 2014-04-11 MED ORDER — INSULIN DETEMIR 100 UNIT/ML ~~LOC~~ SOLN
35.0000 [IU] | Freq: Every day | SUBCUTANEOUS | Status: DC
  Administered 2014-04-11 – 2014-04-13 (×3): 35 [IU] via SUBCUTANEOUS

## 2014-04-11 MED ORDER — INSULIN ASPART 100 UNIT/ML ~~LOC~~ SOLN: 0.0000 [IU] | Freq: Every day | SUBCUTANEOUS | Status: DC

## 2014-04-11 MED ORDER — FAMOTIDINE 20 MG PO TABS
20.0000 mg | ORAL_TABLET | Freq: Two times a day (BID) | ORAL | Status: DC
  Administered 2014-04-11 – 2014-04-14 (×6): 20 mg via ORAL
  Filled 2014-04-11 (×11): qty 1

## 2014-04-11 MED ORDER — ALUM & MAG HYDROXIDE-SIMETH 200-200-20 MG/5ML PO SUSP: 30.0000 mL | ORAL | Status: DC | PRN

## 2014-04-11 MED ORDER — IBUPROFEN 800 MG PO TABS
800.0000 mg | ORAL_TABLET | Freq: Four times a day (QID) | ORAL | Status: DC | PRN
  Administered 2014-04-11 – 2014-04-14 (×5): 800 mg via ORAL
  Filled 2014-04-11 (×5): qty 1

## 2014-04-11 MED ORDER — MAGNESIUM HYDROXIDE 400 MG/5ML PO SUSP: 30.0000 mL | Freq: Every day | ORAL | Status: DC | PRN

## 2014-04-11 MED ORDER — RISPERIDONE 1 MG PO TABS
1.0000 mg | ORAL_TABLET | Freq: Two times a day (BID) | ORAL | Status: DC
  Administered 2014-04-11: 1 mg via ORAL
  Filled 2014-04-11 (×2): qty 1

## 2014-04-11 MED ORDER — METFORMIN HCL 500 MG PO TABS
1000.0000 mg | ORAL_TABLET | Freq: Every day | ORAL | Status: DC
  Administered 2014-04-12 – 2014-04-14 (×3): 1000 mg via ORAL
  Filled 2014-04-11 (×5): qty 2

## 2014-04-11 MED ORDER — TRAZODONE HCL 50 MG PO TABS
50.0000 mg | ORAL_TABLET | Freq: Every evening | ORAL | Status: DC | PRN
  Filled 2014-04-11 (×3): qty 1

## 2014-04-11 MED ORDER — INSULIN DETEMIR 100 UNIT/ML ~~LOC~~ SOLN
35.0000 [IU] | Freq: Every day | SUBCUTANEOUS | Status: DC
  Administered 2014-04-11: 35 [IU] via SUBCUTANEOUS
  Filled 2014-04-11: qty 0.35

## 2014-04-11 MED ORDER — DIPHENHYDRAMINE HCL 25 MG PO CAPS: 50.0000 mg | ORAL_CAPSULE | Freq: Every evening | ORAL | Status: DC | PRN

## 2014-04-11 MED ORDER — INSULIN ASPART 100 UNIT/ML ~~LOC~~ SOLN
0.0000 [IU] | Freq: Three times a day (TID) | SUBCUTANEOUS | Status: DC
  Administered 2014-04-11: 5 [IU] via SUBCUTANEOUS
  Administered 2014-04-11: 8 [IU] via SUBCUTANEOUS
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
## 2014-04-11 NOTE — Progress Notes (Signed)
Clinical Social Work  Patient accepted to [facility] [phone]. RN to call report to [phone]. CSW informed patient of DC plans to [facility] Patient upset that she is unable to go to a hospital in a [place] but is agreeable to [facility]. Patient signed voluntary form which was faxed to [facility] and patient has original copy to take with her to [facility]. RN aware of plans and requested transportation at 6:30pm. CSW coordinated transportation via [place] and patient reports she has informed family of plans.  CSW is signing off but available if needed.  [place], [place] [phone]
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
## 2014-04-11 NOTE — Consult Note (Signed)
[facility] Consult   Reason for Consult:  Suicidal attempt Referring Physician:  Dr. Enis Slipper Terri Webster is an 48 y.o. female. Total Time spent with patient: 45 minutes  Assessment: AXIS I:  Major Depression, Recurrent severe AXIS II:  Deferred AXIS III:   Past Medical History  Diagnosis Date  . Diabetes mellitus without complication   . Hypertension   . GERD (gastroesophageal reflux disease)   . Anxiety    AXIS IV:  other psychosocial or environmental problems, problems related to social environment and problems with primary support group AXIS V:  41-50 serious symptoms  Plan:  Recommend psychiatric Inpatient admission when medically cleared. Supportive therapy provided about ongoing stressors. Recommended no psychiatric medications at this time Appreciate psychiatric consultation Will start risperidone 1 mg PO BID for mood swings May use nicotine patch for nicotine cravings Continue safety sitter Followup as clinically required and may contact [phone] if needed further assistance  Subjective:   Terri Webster is a 48 y.o. female patient admitted with suicidal attempt with medications.  HPI:  Patient was seen face-to-face for psychiatric evaluation examination. Patient was unable to provide linear and goal-directed history secondary to being on sedation. Patient was endorsing symptoms of depression and suicide attempt with the overdose on her medication. Patient will be followed by psychiatry consultation when patient is less sedated unable to provide more history. Patient blood alcohol level is less than 11 and urine drug screen was negative for drug of Abuse. Will recommend inpatient psychiatric hospitalization for crisis stabilization, safety margin and medication management of depression with suicide attempt.  Interval history: patient stated that she has overdose multiple medication with intention to hurt herself because she was in distress due to loss of her  job, car, and apartment due to conflicts with her bf x 25 years. Reportedly she is receiving death threats indirectly, and has B-50 against him since 2013 and has relocated from [place] to [place] two days ago and came to [facility] for leg pain initially and than overdose. Patient given consent to talk to her friend Mrs. Jones at [phone] who may be her support and found out that number is not in service. She has a brother but did not give any details at this time.  Medical History: Terri Webster is a 48 y.o. female with a history of IDDM, HTN and Anxiety who was being taken from the ED after discharge to a homeless shelter when she told the police officers she had taken a handful of her medications. She was evaluated in the ED and pill counts of her medication bottles which were filled 2 days ago revealed that she had taken 30 HCTZ tablets, 18 Hydroxyzine tablets, and 11 Risperdone tablets in an attempt to kill herself. She has had unfortunate life circumstances and had become homeless and her children have been taken away from her. She reports increased anxiety and feelings of helplessness, and reports trying to end it all. She had ingested the medications 2 hours before arrival back to the ED, and Poison control was contacted and recommended cardiovascular and neurologic monitoring. She was referred for medical admission. She was hemodynamically stable and obtunded but arousable at the time of interview.  Review of Systems: Unable to Obtain from the Patient  HPI Elements:   Location:  depression and overdose. Quality:  acute. Severity:  suicidal attempt. Timing:  multiple psychosocial stresses.  Past Psychiatric History: Past Medical History  Diagnosis Date  . Diabetes mellitus without  complication   . Hypertension   . GERD (gastroesophageal reflux disease)   . Anxiety     reports that she has been smoking.  She does not have any smokeless tobacco history on file. She reports that she does not  drink alcohol or use illicit drugs. No family history on file.         Allergies:  No Known Allergies  ACT Assessment Complete:  NO Objective: Blood pressure 111/62, pulse 97, temperature 99.3 F (37.4 C), temperature source Axillary, resp. rate 14, height 5' 4"  (1.626 m), weight 94.2 kg (207 lb 10.8 oz), SpO2 95.00%.Body mass index is 35.63 kg/(m^2). Results for orders placed during the hospital encounter of 04/10/14 (from the past 72 hour(s))  CBC     Status: None   Collection Time    04/10/14 12:41 AM      Result Value Ref Range   WBC 6.7  4.0 - 10.5 K/uL   RBC 3.95  3.87 - 5.11 MIL/uL   Hemoglobin 12.3  12.0 - 15.0 g/dL   HCT 36.7  36.0 - 46.0 %   MCV 92.9  78.0 - 100.0 fL   MCH 31.1  26.0 - 34.0 pg   MCHC 33.5  30.0 - 36.0 g/dL   RDW 13.2  11.5 - 15.5 %   Platelets 324  150 - 400 K/uL  COMPREHENSIVE METABOLIC PANEL     Status: Abnormal   Collection Time    04/10/14 12:41 AM      Result Value Ref Range   Sodium 136 (*) 137 - 147 mEq/L   Potassium 3.5 (*) 3.7 - 5.3 mEq/L   Chloride 97  96 - 112 mEq/L   CO2 26  19 - 32 mEq/L   Glucose, Bld 382 (*) 70 - 99 mg/dL   BUN 14  6 - 23 mg/dL   Creatinine, Ser 0.70  0.50 - 1.10 mg/dL   Calcium 8.9  8.4 - 10.5 mg/dL   Total Protein 6.8  6.0 - 8.3 g/dL   Albumin 3.6  3.5 - 5.2 g/dL   AST 10  0 - 37 U/L   ALT 13  0 - 35 U/L   Alkaline Phosphatase 79  39 - 117 U/L   Total Bilirubin 0.2 (*) 0.3 - 1.2 mg/dL   GFR calc non Af Amer >90  >90 mL/min   GFR calc Af Amer >90  >90 mL/min   Comment: (NOTE)     The eGFR has been calculated using the CKD EPI equation.     This calculation has not been validated in all clinical situations.     eGFR's persistently <90 mL/min signify possible Chronic Kidney     Disease.  ETHANOL     Status: None   Collection Time    04/10/14 12:41 AM      Result Value Ref Range   Alcohol, Ethyl (B) <11  0 - 11 mg/dL   Comment:            LOWEST DETECTABLE LIMIT FOR     SERUM ALCOHOL IS 11 mg/dL      FOR MEDICAL PURPOSES ONLY  ACETAMINOPHEN LEVEL     Status: None   Collection Time    04/10/14 12:41 AM      Result Value Ref Range   Acetaminophen (Tylenol), Serum <15.0  10 - 30 ug/mL   Comment:            THERAPEUTIC CONCENTRATIONS VARY     SIGNIFICANTLY. A RANGE  OF 10-30     ug/mL MAY BE AN EFFECTIVE     CONCENTRATION FOR MANY PATIENTS.     HOWEVER, SOME ARE BEST TREATED     AT CONCENTRATIONS OUTSIDE THIS     RANGE.     ACETAMINOPHEN CONCENTRATIONS     >150 ug/mL AT 4 HOURS AFTER     INGESTION AND >50 ug/mL AT 12     HOURS AFTER INGESTION ARE     OFTEN ASSOCIATED WITH TOXIC     REACTIONS.  SALICYLATE LEVEL     Status: Abnormal   Collection Time    04/10/14 12:41 AM      Result Value Ref Range   Salicylate Lvl <5.5 (*) 2.8 - 20.0 mg/dL  HEMOGLOBIN A1C     Status: Abnormal   Collection Time    04/10/14 12:41 AM      Result Value Ref Range   Hemoglobin A1C 9.3 (*) <5.7 %   Comment: (NOTE)                                                                               According to the ADA Clinical Practice Recommendations for 2011, when     HbA1c is used as a screening test:      >=6.5%   Diagnostic of Diabetes Mellitus               (if abnormal result is confirmed)     5.7-6.4%   Increased risk of developing Diabetes Mellitus     References:Diagnosis and Classification of Diabetes Mellitus,Diabetes     HRCB,6384,53(MIWOE 1):S62-S69 and Standards of Medical Care in             Diabetes - 2011,Diabetes Care,2011,34 (Suppl 1):S11-S61.   Mean Plasma Glucose 220 (*) <117 mg/dL   Comment: Performed at [facility] ([place])     Status: None   Collection Time    04/10/14  1:09 AM      Result Value Ref Range   Opiates NONE DETECTED  NONE DETECTED   Comment: DELTA CHECK NOTED   Cocaine NONE DETECTED  NONE DETECTED   Benzodiazepines NONE DETECTED  NONE DETECTED   Amphetamines NONE DETECTED  NONE DETECTED   Comment: DELTA CHECK NOTED    Tetrahydrocannabinol NONE DETECTED  NONE DETECTED   Barbiturates NONE DETECTED  NONE DETECTED   Comment:            DRUG SCREEN FOR MEDICAL PURPOSES     ONLY.  IF CONFIRMATION IS NEEDED     FOR ANY PURPOSE, NOTIFY LAB     WITHIN 5 DAYS.                LOWEST DETECTABLE LIMITS     FOR URINE DRUG SCREEN     Drug Class       Cutoff (ng/mL)     Amphetamine      1000     Barbiturate      200     Benzodiazepine   321     Tricyclics       224     Opiates  300     Cocaine          300     THC              50  POC URINE PREG, ED     Status: None   Collection Time    04/10/14  1:17 AM      Result Value Ref Range   Preg Test, Ur NEGATIVE  NEGATIVE   Comment:            THE SENSITIVITY OF THIS     METHODOLOGY IS >24 mIU/mL  ACETAMINOPHEN LEVEL     Status: None   Collection Time    04/10/14  3:45 AM      Result Value Ref Range   Acetaminophen (Tylenol), Serum <15.0  10 - 30 ug/mL   Comment:            THERAPEUTIC CONCENTRATIONS VARY     SIGNIFICANTLY. A RANGE OF 10-30     ug/mL MAY BE AN EFFECTIVE     CONCENTRATION FOR MANY PATIENTS.     HOWEVER, SOME ARE BEST TREATED     AT CONCENTRATIONS OUTSIDE THIS     RANGE.     ACETAMINOPHEN CONCENTRATIONS     >150 ug/mL AT 4 HOURS AFTER     INGESTION AND >50 ug/mL AT 12     HOURS AFTER INGESTION ARE     OFTEN ASSOCIATED WITH TOXIC     REACTIONS.  GLUCOSE, CAPILLARY     Status: Abnormal   Collection Time    04/10/14  6:21 AM      Result Value Ref Range   Glucose-Capillary 223 (*) 70 - 99 mg/dL  MRSA PCR SCREENING     Status: None   Collection Time    04/10/14  6:33 AM      Result Value Ref Range   MRSA by PCR NEGATIVE  NEGATIVE   Comment:            The GeneXpert MRSA Assay (FDA     approved for NASAL specimens     only), is one component of a     comprehensive MRSA colonization     surveillance program. It is not     intended to diagnose MRSA     infection nor to guide or     monitor treatment for     MRSA infections.   BASIC METABOLIC PANEL     Status: Abnormal   Collection Time    04/10/14  7:40 AM      Result Value Ref Range   Sodium 139  137 - 147 mEq/L   Potassium 3.4 (*) 3.7 - 5.3 mEq/L   Chloride 102  96 - 112 mEq/L   CO2 27  19 - 32 mEq/L   Glucose, Bld 210 (*) 70 - 99 mg/dL   BUN 10  6 - 23 mg/dL   Creatinine, Ser 0.57  0.50 - 1.10 mg/dL   Calcium 8.7  8.4 - 10.5 mg/dL   GFR calc non Af Amer >90  >90 mL/min   GFR calc Af Amer >90  >90 mL/min   Comment: (NOTE)     The eGFR has been calculated using the CKD EPI equation.     This calculation has not been validated in all clinical situations.     eGFR's persistently <90 mL/min signify possible Chronic Kidney     Disease.  CBC     Status: None   Collection Time  04/10/14  7:40 AM      Result Value Ref Range   WBC 6.3  4.0 - 10.5 K/uL   RBC 3.93  3.87 - 5.11 MIL/uL   Hemoglobin 12.2  12.0 - 15.0 g/dL   HCT 37.2  36.0 - 46.0 %   MCV 94.7  78.0 - 100.0 fL   MCH 31.0  26.0 - 34.0 pg   MCHC 32.8  30.0 - 36.0 g/dL   RDW 13.2  11.5 - 15.5 %   Platelets 294  150 - 400 K/uL  GLUCOSE, CAPILLARY     Status: Abnormal   Collection Time    04/10/14  8:25 AM      Result Value Ref Range   Glucose-Capillary 166 (*) 70 - 99 mg/dL  GLUCOSE, CAPILLARY     Status: Abnormal   Collection Time    04/10/14  9:41 AM      Result Value Ref Range   Glucose-Capillary 156 (*) 70 - 99 mg/dL  GLUCOSE, CAPILLARY     Status: Abnormal   Collection Time    04/10/14  3:19 PM      Result Value Ref Range   Glucose-Capillary 111 (*) 70 - 99 mg/dL  GLUCOSE, CAPILLARY     Status: Abnormal   Collection Time    04/10/14  8:08 PM      Result Value Ref Range   Glucose-Capillary 110 (*) 70 - 99 mg/dL  BASIC METABOLIC PANEL     Status: Abnormal   Collection Time    04/11/14  3:31 AM      Result Value Ref Range   Sodium 139  137 - 147 mEq/L   Potassium 3.7  3.7 - 5.3 mEq/L   Chloride 102  96 - 112 mEq/L   CO2 24  19 - 32 mEq/L   Glucose, Bld 265 (*) 70 - 99 mg/dL    BUN 10  6 - 23 mg/dL   Creatinine, Ser 0.71  0.50 - 1.10 mg/dL   Calcium 8.8  8.4 - 10.5 mg/dL   GFR calc non Af Amer >90  >90 mL/min   GFR calc Af Amer >90  >90 mL/min   Comment: (NOTE)     The eGFR has been calculated using the CKD EPI equation.     This calculation has not been validated in all clinical situations.     eGFR's persistently <90 mL/min signify possible Chronic Kidney     Disease.  GLUCOSE, CAPILLARY     Status: Abnormal   Collection Time    04/11/14  7:23 AM      Result Value Ref Range   Glucose-Capillary 213 (*) 70 - 99 mg/dL   Comment 1 Notify RN     Comment 2 Documented in Chart     Labs are reviewed and are pertinent for .  Current Facility-Administered Medications  Medication Dose Route Frequency Provider Last Rate Last Dose  . 0.9 %  sodium chloride infusion   Intravenous Continuous Ripudeep Krystal Eaton, MD 75 mL/hr at 04/11/14 0734    . acetaminophen (TYLENOL) tablet 650 mg  650 mg Oral Q6H PRN Theressa Millard, MD       Or  . acetaminophen (TYLENOL) suppository 650 mg  650 mg Rectal Q6H PRN Theressa Millard, MD      . alum & mag hydroxide-simeth (MAALOX/MYLANTA) 200-200-20 MG/5ML suspension 30 mL  30 mL Oral Q6H PRN Theressa Millard, MD      . enoxaparin (LOVENOX) injection 50  mg  50 mg Subcutaneous Q24H Audrea Muscat Troy, Ackerman   50 mg at 04/11/14 3143  . HYDROmorphone (DILAUDID) injection 0.5-1 mg  0.5-1 mg Intravenous Q3H PRN Theressa Millard, MD      . insulin aspart (novoLOG) injection 0-15 Units  0-15 Units Subcutaneous TID WC Ripudeep Krystal Eaton, MD   5 Units at 04/11/14 0915  . insulin aspart (novoLOG) injection 0-5 Units  0-5 Units Subcutaneous QHS Ripudeep K Rai, MD      . insulin detemir (LEVEMIR) injection 35 Units  35 Units Subcutaneous Daily Ripudeep Krystal Eaton, MD   35 Units at 04/11/14 0915  . ondansetron (ZOFRAN) tablet 4 mg  4 mg Oral Q6H PRN Theressa Millard, MD       Or  . ondansetron (ZOFRAN) injection 4 mg  4 mg Intravenous Q6H PRN Harvette Evonnie Dawes, MD      . oxyCODONE (Oxy IR/ROXICODONE) immediate release tablet 5 mg  5 mg Oral Q4H PRN Theressa Millard, MD      . pantoprazole (PROTONIX) EC tablet 40 mg  40 mg Oral Q0600 Ripudeep Krystal Eaton, MD   40 mg at 04/11/14 0915  . pneumococcal 23 valent vaccine (PNU-IMMUNE) injection 0.5 mL  0.5 mL Intramuscular Tomorrow-1000 Ripudeep Krystal Eaton, MD        Psychiatric Specialty Exam: Physical Exam  ROS  Blood pressure 111/62, pulse 97, temperature 99.3 F (37.4 C), temperature source Axillary, resp. rate 14, height 5' 4"  (1.626 m), weight 94.2 kg (207 lb 10.8 oz), SpO2 95.00%.Body mass index is 35.63 kg/(m^2).  General Appearance: Patient has been sedated, Not appeared to be in distress  Eye Contact::  Minimal  Speech:  Blocked and Slow  Volume:  Decreased  Mood:  Anxious, Depressed, Hopeless and Worthless  Affect:  Depressed and Flat  Thought Process:  Linear  Orientation:  NA  Thought Content:  Rumination  Suicidal Thoughts:  Yes.  with intent/plan  Homicidal Thoughts:  No  Memory:  Immediate;   Fair  Judgement:  Impaired  Insight:  Lacking  Psychomotor Activity:  Psychomotor Retardation  Concentration:  Poor  Recall:  [place]: Fair  Akathisia:  NA  Handed:  Right  AIMS (if indicated):     Assets:  Communication Skills Leisure Time Resilience  Sleep:      Musculoskeletal: Strength & Muscle Tone: within normal limits Gait & Station: unable to stand Patient leans: N/A  Treatment Plan Summary: Daily contact with patient to assess and evaluate symptoms and progress in treatment Medication management  Leitha Bleak 04/11/2014 10:28 AM

## 2014-04-11 NOTE — Progress Notes (Signed)
Called report to [place] at [facility] in [place].  Erick Blinks, RN

## 2014-04-11 NOTE — Progress Notes (Signed)
Nutrition Brief Note  Pt meets criteria for severe MALNUTRITION in the context of acute illness as evidenced by <50% estimated energy intake with 11% weight loss in the past 1.5 weeks per pt report.  Patient identified on the Malnutrition Screening Tool (MST) Report  Wt Readings from Last 15 Encounters:  04/11/14 207 lb 10.8 oz (94.2 kg)    Body mass index is 35.63 kg/(m^2). Patient meets criteria for class II obesity based on current BMI.   Current diet order is CHO modified, patient is consuming approximately 100% of meals at this time. Labs and medications reviewed. Admitted with overdose. Met with pt who reports 25 pound unintended weight loss in the past 1.5 weeks. States she was eating at least 1 snack/day at home during this time frame but not much else because she wasn't hungry. Noted plans to d/c today. Pt currently eating well and did not want any nutritional supplements.   No nutrition interventions warranted at this time. If nutrition issues arise, please consult RD.   Carlis Stable MS, [place], LDN [phone] Pager [phone] Weekend/After Hours Pager

## 2014-04-11 NOTE — Discharge Summary (Signed)
Physician Discharge Summary  Patient ID: Terri Webster MRN: [id number] DOB/AGE: Feb 17, 1966 48 y.o.  Admit date: 04/10/2014 Discharge date: 04/11/2014  Primary Care Physician:  No primary provider on file.  Discharge Diagnoses:    . Drug overdose, multiple drugs . Overdose/suicide attempt  . Diabetes mellitus . Hypertension  Consults:  Psychiatry, Dr Elsie SaasJonnalagadda   Recommendations for Outpatient Follow-up:  Patient is to followup with outpatient primary physician for her diabetes and hypertension  Allergies:  No Known Allergies   Discharge Medications:   Medication List    STOP taking these medications       gabapentin 300 MG capsule  Commonly known as:  NEURONTIN     hydrochlorothiazide 25 MG tablet  Commonly known as:  HYDRODIURIL     risperiDONE 2 MG tablet  Commonly known as:  RISPERDAL     traMADol 50 MG tablet  Commonly known as:  ULTRAM      TAKE these medications       ibuprofen 800 MG tablet  Commonly known as:  ADVIL,MOTRIN  Take 800 mg by mouth every 8 (eight) hours as needed for moderate pain.     insulin detemir 100 UNIT/ML injection  Commonly known as:  LEVEMIR  Inject 35 Units into the skin at bedtime.     metFORMIN 500 MG tablet  Commonly known as:  GLUCOPHAGE  Take 1,000 mg by mouth daily with breakfast.     ranitidine 150 MG tablet  Commonly known as:  ZANTAC  Take 150 mg by mouth 2 (two) times daily.         Brief H and P: For complete details please refer to admission H and P, but in brief Terri Webster is a 48 y.o. female with a history of IDDM, HTN and Anxiety who was being taken from the ED after discharge to a homeless shelter when she told the police officers she had taken a handful of her medications. She was evaluated in the ED and pill counts of her medication bottles which were filled 2 days ago revealed that she had taken 30 HCTZ tablets, 18 Hydroxyzine tablets, and 11 Risperdone tablets in an attempt to kill herself. She  has had unfortunate life circumstances and had become homeless and her children have been taken away from her. She reported increased anxiety and feelings of helplessness, and reported trying to end it all. She had ingested the medications 2 hours before arrival back to the ED, and Poison control was contacted and recommended cardiovascular and neurologic monitoring. She was referred for medical admission. She was hemodynamically stable and obtunded but arousable at the time of interview.   Hospital Course:   Overdose intentional for suicide attempt: On HCTZ, hydroxyzine, risperidone tablets with suicidal attempt. Patient was admitted to step down unit. She was hemodynamically stable but very somnolent at the time of admission. Hasn't control was contacted in ED and recommended cardiovascular and neurological monitoring. EKG showed sinus rhythm, QT/QTC 387/449. Patient was placed with one-to-one sitter for safety. Psychiatry was consulted and recommended inpatient psychiatry admission for further management. Patient is currently medically cleared and stable for further psychiatry care and [facility]  Diabetes mellitus : Insulin-dependent  Hemoglobin A1c 9.3, patient was placed on sliding scale insulin, Levemir 35 units per her home dose was added today  Continue on carb modified   Hypertension : Currently stable, patient was given IV fluid hydration due to excessive overdose on HCTZ tablets. Her creatinine function has remained stable. Potassium  was low at the time of admission at 3.5 which has been replaced, currently at 3.7. The patient's blood pressure has been stable, no need of HCTZ at this time.  Hypokalemia: - Likely due to excessive HCTZ, received potassium replacement  Bipolar disorder, depression, suicide attempt: Further management per psychiatry    Day of Discharge BP 111/62  Pulse 97  Temp(Src) 99.3 F (37.4 C) (Axillary)  Resp 14  Ht 5\' 4"  (1.626 m)  Wt 94.2 kg (207 lb  10.8 oz)  BMI 35.63 kg/m2  SpO2 95%  Physical Exam: General: Alert and awake oriented x3 not in any acute distress. CVS: S1-S2 clear no murmur rubs or gallops Chest: clear to auscultation bilaterally, no wheezing rales or rhonchi Abdomen: soft nontender, nondistended, normal bowel sounds Extremities: no cyanosis, clubbing or edema noted bilaterally Neuro: Cranial nerves II-XII intact, no focal neurological deficits   The results of significant diagnostics from this hospitalization (including imaging, microbiology, ancillary and laboratory) are listed below for reference.    LAB RESULTS: Basic Metabolic Panel:  Recent Labs Lab 04/10/14 0740 04/11/14 0331  NA 139 139  K 3.4* 3.7  CL 102 102  CO2 27 24  GLUCOSE 210* 265*  BUN 10 10  CREATININE 0.57 0.71  CALCIUM 8.7 8.8   Liver Function Tests:  Recent Labs Lab 04/09/14 0126 04/10/14 0041  AST 10 10  ALT 13 13  ALKPHOS 75 79  BILITOT 0.3 0.2*  PROT 7.0 6.8  ALBUMIN 3.7 3.6    Recent Labs Lab 04/09/14 0126  LIPASE 38   No results found for this basename: AMMONIA,  in the last 168 hours CBC:  Recent Labs Lab 04/09/14 0126 04/10/14 0041 04/10/14 0740  WBC 7.1 6.7 6.3  NEUTROABS 3.8  --   --   HGB 13.0 12.3 12.2  HCT 37.9 36.7 37.2  MCV 93.1 92.9 94.7  PLT 345 324 294   Cardiac Enzymes:  Recent Labs Lab 04/09/14 0126  TROPONINI <0.30   BNP: No components found with this basename: POCBNP,  CBG:  Recent Labs Lab 04/10/14 2008 04/11/14 0723  GLUCAP 110* 213*    Significant Diagnostic Studies:  No results found.     Disposition and Follow-up:    DISPOSITION: To behavioral health when bed available  DIET: Carb modified    DISCHARGE FOLLOW-UP: With behavioral health and PCP     Time spent on Discharge: 35 mins   Signed:   Neriyah Cercone Jenna Luo M.D. [facility] 04/11/2014, 11:49 AM Pager: [phone]   **Disclaimer: This note was dictated with voice recognition software.  Similar sounding words can inadvertently be transcribed and this note may contain transcription errors which may not have been corrected upon publication of note.**

## 2014-04-11 NOTE — Progress Notes (Signed)
Clinical Social Work  Per MD, patient is medically stable to DC to inpatient psych facility. CSW has contacted the following facilities:  [facility]- no available beds  [facility]- no available beds  [facility]- AC Inetta Fermo) is reviewing and will alert CSW if bed is available  [facility]- no available beds  [facility]- possible beds available. Referral faxed.  [facility]- anticipated DC this afternoon. Referral faxed.  [facility]- left a message with admissions inquiring about bed availability  [facility]- possible DC this afternoon. Referral faxed.  [facility]- no available beds  [facility]- waiting list. Referral faxed.  [facility]- no available beds  [facility]- admissions will have to verify Medicaid to determine if patient is eligible for [facility] beds  CSW will continue to follow.  [place], [place] [phone]
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
## 2014-04-11 NOTE — Progress Notes (Signed)
Adult Psychoeducational Group Note  Date:  04/11/2014 Time:  9:51 PM  Group Topic/Focus:  Wrap-Up Group:   The focus of this group is to help patients review their daily goal of treatment and discuss progress on daily workbooks.  Participation Level:  Did Not Attend  Participation Quality:  Drowsy and Inattentive  Affect:  Flat and Irritable  Cognitive:  Confused  Insight: Lacking  Engagement in Group:  none  Modes of Intervention:  None   Additional Comments:  PT was no here in time for Group   Vuong Musa R Aileena Iglesia 04/11/2014, 9:51 PM

## 2014-04-11 NOTE — Progress Notes (Signed)
Patient ID: Terri Webster  female  ZOX:[id number]    DOB: Apr 20, 1966    DOA: 04/10/2014  PCP: No primary provider on file.  Assessment/Plan: Principal Problem:   Overdose intentional for suicide attempt: On HCTZ, hydroxyzine, risperidone tablets with suicidal attempt - alert and awake, no acute issues overnight, tolerating her diet -  Continue one-to-one sitter - Psych consulted  Active Problems:   Drug overdose, multiple drugs: As #1    Diabetes mellitus - Continue sliding scale insulin, placed on carb modified - Start Levemir 35 units daily    Hypertension -  Currently stable, decrease IV fluids  Hypokalemia: - Likely due to excessive HCTZ, placed on IV replacement   DVT Prophylaxis: Lovenox  Code Status: Full code  Family Communication:  Disposition: Patient is medically stable, transferred to the telemetry floor, DC to inpatient psych once bed available  Consultants: Psychiatry   Procedures:  None  Antibiotics:  None    Subjective: Patient seen and examined, alert and awake, no acute complaints  Objective: Weight change: -0.3 kg (-10.6 oz)  Intake/Output Summary (Last 24 hours) at 04/11/14 0732 Last data filed at 04/11/14 0645  Gross per 24 hour  Intake 3309.58 ml  Output    730 ml  Net 2579.58 ml   Blood pressure 107/58, pulse 90, temperature 99.4 F (37.4 C), temperature source Oral, resp. rate 16, height 5\' 4"  (1.626 m), weight 94.2 kg (207 lb 10.8 oz), SpO2 95.00%.  Physical Exam: General: Alert and oriented x3 CVS: S1-S2 clear, no murmur rubs or gallops Chest: clear to auscultation bilaterally, no wheezing, rales or rhonchi Abdomen: soft nontender, nondistended, normal bowel sounds  Extremities: no cyanosis, clubbing or edema noted bilaterally Neuro: No focal neurological deficits noted, strength 5/5 in upper and lower extremities bilaterally  Lab Results: Basic Metabolic Panel:  Recent Labs Lab 04/10/14 0740 04/11/14 0331  NA 139  139  K 3.4* 3.7  CL 102 102  CO2 27 24  GLUCOSE 210* 265*  BUN 10 10  CREATININE 0.57 0.71  CALCIUM 8.7 8.8   Liver Function Tests:  Recent Labs Lab 04/09/14 0126 04/10/14 0041  AST 10 10  ALT 13 13  ALKPHOS 75 79  BILITOT 0.3 0.2*  PROT 7.0 6.8  ALBUMIN 3.7 3.6    Recent Labs Lab 04/09/14 0126  LIPASE 38   No results found for this basename: AMMONIA,  in the last 168 hours CBC:  Recent Labs Lab 04/09/14 0126 04/10/14 0041 04/10/14 0740  WBC 7.1 6.7 6.3  NEUTROABS 3.8  --   --   HGB 13.0 12.3 12.2  HCT 37.9 36.7 37.2  MCV 93.1 92.9 94.7  PLT 345 324 294   Cardiac Enzymes:  Recent Labs Lab 04/09/14 0126  TROPONINI <0.30   BNP: No components found with this basename: POCBNP,  CBG:  Recent Labs Lab 04/10/14 0621 04/10/14 0825 04/10/14 0941 04/10/14 1519 04/10/14 2008  GLUCAP 223* 166* 156* 111* 110*     Micro Results: Recent Results (from the past 240 hour(s))  MRSA PCR SCREENING     Status: None   Collection Time    04/10/14  6:33 AM      Result Value Ref Range Status   MRSA by PCR NEGATIVE  NEGATIVE Final   Comment:            The GeneXpert MRSA Assay (FDA     approved for NASAL specimens     only), is one component of a  comprehensive MRSA colonization     surveillance program. It is not     intended to diagnose MRSA     infection nor to guide or     monitor treatment for     MRSA infections.    Studies/Results: Ct Abdomen Pelvis W Contrast  04/09/2014   CLINICAL DATA:  Abdominal pain.  EXAM: CT ABDOMEN AND PELVIS WITH CONTRAST  TECHNIQUE: Multidetector CT imaging of the abdomen and pelvis was performed using the standard protocol following bolus administration of intravenous contrast.  CONTRAST:  36mL OMNIPAQUE IOHEXOL 300 MG/ML SOLN, OMNIPAQUE IOHEXOL 300 MG/ML SOLN  COMPARISON:  11/11/2003.  FINDINGS: The lung bases are clear except for streaky areas of atelectasis and scarring. Heart is normal in size. No pericardial  effusion.  The liver is unremarkable. Multiple small gallstones are noted the gallbladder. No common bile duct dilatation. The pancreas appears normal. The spleen is normal in size no focal lesions. The adrenal glands and kidneys are unremarkable.  The stomach, duodenum, small bowel and colon are grossly normal. No inflammatory changes, mass lesions or obstructive findings. The appendix is normal. No mesenteric or retroperitoneal mass or adenopathy. The aorta and branch vessels are patent.  The uterus contains an IUD. There is a calcified uterine fibroid noted. Tubal ligation clips are present. The ovaries are normal. The bladder is normal. No pelvic mass, adenopathy or free pelvic fluid collections. No inguinal mass or adenopathy.  The bony structures are unremarkable.  IMPRESSION: No acute abdominal/ pelvic findings, mass lesions or adenopathy.  Chololithiasis.   Electronically Signed   By: Loralie Champagne M.D.   On: 04/09/2014 04:00    Medications: Scheduled Meds: . enoxaparin (LOVENOX) injection  50 mg Subcutaneous Q24H  . insulin aspart  0-15 Units Subcutaneous TID WC  . insulin aspart  0-5 Units Subcutaneous QHS  . insulin detemir  35 Units Subcutaneous Daily  . pantoprazole  40 mg Oral Q0600  . pneumococcal 23 valent vaccine  0.5 mL Intramuscular Tomorrow-1000      LOS: 1 day   Leyda Vanderwerf Jenna Luo M.D. [facility] 04/11/2014, 7:32 AM Pager: [phone]  If 7PM-7AM, please contact night-coverage [email or web address] Password [email or web address]  **Disclaimer: This note was dictated with voice recognition software. Similar sounding words can inadvertently be transcribed and this note may contain transcription errors which may not have been corrected upon publication of note.**

## 2014-04-11 NOTE — Tx Team (Signed)
Initial Interdisciplinary Treatment Plan  PATIENT STRENGTHS: (choose at least two) Ability for insight Average or above average intelligence Capable of independent living General fund of knowledge Motivation for treatment/growth Religious Affiliation  PATIENT STRESSORS: Financial difficulties Loss of Grandmother   PROBLEM LIST: Problem List/Patient Goals Date to be addressed Date deferred Reason deferred Estimated date of resolution  Increased risk for SI due to OD 6/8     Homeless 6/8     *denies any current psychosocial symptoms 6/8                                          DISCHARGE CRITERIA:  Improved stabilization in mood, thinking, and/or behavior Need for constant or close observation no longer present Reduction of life-threatening or endangering symptoms to within safe limits Verbal commitment to aftercare and medication compliance  PRELIMINARY DISCHARGE PLAN: Attend PHP/IOP  PATIENT/FAMIILY INVOLVEMENT: This treatment plan has been presented to and reviewed with the patient, Terri Webster.  The patient and family have been given the opportunity to ask questions and make suggestions.  Clive Parcel Grayling Congress 04/11/2014, 10:37 PM

## 2014-04-11 NOTE — Progress Notes (Signed)
Got home medications from Pharmacy and gave to Donnetta Hutching to give to [facility] when he checks pt in.  Erick Blinks, RN

## 2014-04-12 DIAGNOSIS — T50992A Poisoning by other drugs, medicaments and biological substances, intentional self-harm, initial encounter: Secondary | ICD-10-CM

## 2014-04-12 DIAGNOSIS — F39 Unspecified mood [affective] disorder: Secondary | ICD-10-CM

## 2014-04-12 DIAGNOSIS — T502X1A Poisoning by carbonic-anhydrase inhibitors, benzothiadiazides and other diuretics, accidental (unintentional), initial encounter: Secondary | ICD-10-CM

## 2014-04-12 LAB — GLUCOSE, CAPILLARY
Glucose-Capillary: 209 mg/dL — ABNORMAL HIGH (ref 70–99)
Glucose-Capillary: 223 mg/dL — ABNORMAL HIGH (ref 70–99)
Glucose-Capillary: 225 mg/dL — ABNORMAL HIGH (ref 70–99)
Glucose-Capillary: 212 mg/dL — ABNORMAL HIGH (ref 70–99)

## 2014-04-12 MED ORDER — SERTRALINE HCL 50 MG PO TABS
50.0000 mg | ORAL_TABLET | Freq: Every day | ORAL | Status: DC
  Filled 2014-04-12 (×5): qty 1

## 2014-04-12 MED ORDER — RISPERIDONE 3 MG PO TABS
3.0000 mg | ORAL_TABLET | Freq: Every day | ORAL | Status: DC
  Filled 2014-04-12 (×2): qty 1
  Filled 2014-04-12: qty 3
  Filled 2014-04-12: qty 1

## 2014-04-12 MED ORDER — HALOPERIDOL 5 MG PO TABS
5.0000 mg | ORAL_TABLET | Freq: Once | ORAL | Status: AC
  Administered 2014-04-12: 5 mg via ORAL
  Filled 2014-04-12 (×2): qty 1

## 2014-04-12 MED ORDER — NICOTINE 21 MG/24HR TD PT24
21.0000 mg | MEDICATED_PATCH | Freq: Every day | TRANSDERMAL | Status: DC
  Administered 2014-04-12 – 2014-04-13 (×2): 21 mg via TRANSDERMAL
  Filled 2014-04-12 (×4): qty 1

## 2014-04-12 MED ORDER — RISPERIDONE 1 MG PO TBDP
1.0000 mg | ORAL_TABLET | Freq: Once | ORAL | Status: AC
  Administered 2014-04-12: 1 mg via ORAL
  Filled 2014-04-12: qty 1

## 2014-04-12 MED ORDER — RISPERIDONE 1 MG PO TBDP
ORAL_TABLET | ORAL | Status: AC
  Filled 2014-04-12: qty 1
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
## 2014-04-12 NOTE — [facility] Group Notes (Signed)
[facility] LCSW Group Therapy      Feelings About Diagnosis 1:15 - 2:30 PM         04/12/2014    Type of Therapy:  Group Therapy  Participation Level:  Did not attend group.   Wynn Banker 04/12/2014
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
## 2014-04-12 NOTE — Progress Notes (Signed)
Recreation Therapy Notes  Animal-Assisted Activity/Therapy (AAA/T) Program Checklist/Progress Notes Patient Eligibility Criteria Checklist & Daily Group note for Rec Tx Intervention  Date: 06.09.2015 Time: 2:45pm Location: [address]    AAA/T Program Assumption of Risk Form signed by Patient/ or Parent Legal Guardian yes  Patient is free of allergies or sever asthma yes  Patient reports no fear of animals yes  Patient reports no history of cruelty to animals yes   Patient understands his/her participation is voluntary yes  Behavioral Response: Did not attend.   Marykay Lex Aadvika Konen, LRT/CTRS        Bearett Porcaro L Joshua Soulier 04/12/2014 4:09 PM

## 2014-04-12 NOTE — Progress Notes (Signed)
D:  Patient's self inventory sheet, patient has poor sleep, good appetite, normal energy level.  Rated depression and anxiety #6, hopeless #1.  Denied withdrawals.  Denied SI.  Has experienced headache idn past 24 hours. Pain goal #2, worst pain #2.  Plans to attend appointment with MD's on regular basis, make sure medications are taken and do what MD's suggest.  Plans to discharge to friend's home. Has medicaid for medications. A:  Medications administered per MD orders.  Emotional support and encouragement given patient. R:  Denied SI and HI.  Denied A/V hallucinations.  Will continue to monitor patient for safety with 15 minute checks.  Safety mainted.

## 2014-04-12 NOTE — Progress Notes (Signed)
NUTRITION ASSESSMENT  Pt identified as at risk on the Malnutrition Screen Tool  INTERVENTION: Educated patient on the importance of nutrition and encouraged intake of food and beverages.   NUTRITION DIAGNOSIS: None at this time   Assessment:  Pt admitted after overdosing on her home medications.  Met with pt who reports eating well PTA, 3 meals/day plus snacks with good appetite. Then stated she went for the past 1.5 weeks not eating hardly anything, spent most of her time smoking cigarettes and drinking coffee and feeling stressed and gained 5 pounds. Pt currently eating well during admission and denies needing any nutritional supplements or having any dietary education needs.    48 y.o. female  Height: Ht Readings from Last 1 Encounters:  04/11/14 5' 7"  (1.702 m)    Weight: Wt Readings from Last 1 Encounters:  04/11/14 210 lb (95.255 kg)    Weight Hx: Wt Readings from Last 10 Encounters:  04/11/14 210 lb (95.255 kg)  04/11/14 207 lb 10.8 oz (94.2 kg)    BMI:  Body mass index is 32.88 kg/(m^2). Pt meets criteria for class I obesity based on current BMI.  Estimated Nutritional Needs: Kcal: 15-20 kcal/kg Protein: > 1 gram protein/kg Fluid: 1 ml/kcal  Diet Order: Carb Control Pt is also offered choice of unit snacks mid-morning and mid-afternoon.  Pt is eating as desired.   Lab results and medications reviewed.   Carlis Stable MS, [place], LDN [phone] Pager [phone] Weekend/After Hours Pager

## 2014-04-12 NOTE — Progress Notes (Signed)
Adult Psychoeducational Group Note  Date:  04/12/2014 Time:  9:03 PM  Group Topic/Focus:  Wrap-Up Group:   The focus of this group is to help patients review their daily goal of treatment and discuss progress on daily workbooks.  Participation Level:  Did Not Attend  Participation Quality:  Drowsy  Affect:  Irritable and Resistant  Cognitive:  Lacking  Insight: None  Engagement in Group:  none  Modes of Intervention:  NONE  Additional Comments:  PT decided not to come to group   Kadasia Kassing R Trejan Buda 04/12/2014, 9:03 PM
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
## 2014-04-12 NOTE — Progress Notes (Signed)
Adult Psychoeducational Group Note  Date:  04/12/2014 Time:  9:08 PM  Group Topic/Focus:  Wrap-Up Group:   The focus of this group is to help patients review their daily goal of treatment and discuss progress on daily workbooks.  Participation Level:  Did Not Attend  Participation Quality:  Inattentive  Affect:  Resistant  Cognitive:  Confused and Lacking  Insight: None  Engagement in Group:  Off Topic and Poor  Modes of Intervention:  None  Additional Comments:  PT chose not to come to group, instead she stayed on the phone and walked the halls up and down  Mitra Duling R Sewell Pitner 04/12/2014, 9:08 PM

## 2014-04-12 NOTE — Progress Notes (Signed)
Pt attended spiritual care group on grief and loss facilitated by chaplain Burnis Kingfisher. Group opened with brief discussion and psycho-social ed around grief and loss in relationships and in relation to self - identifying life patterns, circumstances, changes that cause losses. Established group norm of speaking from own life experience. Group goal of establishing open and affirming space for members to share loss and experience with grief, normalize grief experience and provide psycho social education and grief support.   Terri Webster was present in at beginning of group.  She left for a portion of the group and then returned.  She was attentive, but did not contribute to discussion.   Clover Mealy, MDiv
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
## 2014-04-12 NOTE — Progress Notes (Signed)
The focus of this group is to educate the patient on the purpose and policies of crisis stabilization and provide a format to answer questions about their admission.  The group details unit policies and expectations of patients while admitted.  Patient attended 0900 nurse education orientation group this morning.  Patient actively participated, appropriate affect, alert, appropriate insight and engagement.  Today patient will work on 3 goals for discharge.  
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
## 2014-04-12 NOTE — Progress Notes (Signed)
Patient walked to the medication window this evening and complaint of pain on her legs and hands. She requested for  Ibuprofen 800 mg; "I haven't had that since morning". She rated her pain at 9 on scale of 1-10 with 10 the worst. Writer administered PRN Ibuprofen as ordered. Patient received medication without difficulty.  She denied SI/HI and denied hallucinations. She stated; "I don't know how my day is yet". Mood and affect sad and depressed. Q 15 minute check continues as ordered to maintain safety.

## 2014-04-13 DIAGNOSIS — F319 Bipolar disorder, unspecified: Secondary | ICD-10-CM

## 2014-04-13 LAB — GLUCOSE, CAPILLARY
GLUCOSE-CAPILLARY: 158 mg/dL — AB (ref 70–99)
GLUCOSE-CAPILLARY: 184 mg/dL — AB (ref 70–99)
GLUCOSE-CAPILLARY: 170 mg/dL — AB (ref 70–99)
Glucose-Capillary: 174 mg/dL — ABNORMAL HIGH (ref 70–99)

## 2014-04-13 MED ORDER — RISPERIDONE 1 MG PO TABS
1.0000 mg | ORAL_TABLET | Freq: Every day | ORAL | Status: DC
  Filled 2014-04-13: qty 1

## 2014-04-13 MED ORDER — RISPERIDONE 1 MG PO TBDP
1.0000 mg | ORAL_TABLET | Freq: Every day | ORAL | Status: DC
  Administered 2014-04-13: 1 mg via ORAL
  Filled 2014-04-13 (×4): qty 1

## 2014-04-13 NOTE — Progress Notes (Signed)
Adult Psychoeducational Group Note  Date:  04/13/2014 Time:  11:59 PM  Group Topic/Focus:  Wrap-Up Group:   The focus of this group is to help patients review their daily goal of treatment and discuss progress on daily workbooks.  Participation Level:  None  Participation Quality:  Inattentive  Affect:  Defensive  Cognitive:  Confused  Insight: Improving  Engagement in Group:  None  Modes of Intervention:  Discussion  Additional Comments:  The patient seem quite defensive when spoken too.The patient said that she felt unsafe at [facility].  Octavio Manns 04/13/2014, 11:59 PM

## 2014-04-13 NOTE — [facility] Group Notes (Signed)
[facility] LCSW Aftercare Discharge Planning Group Note   04/13/2014 11:08 AM    Participation Quality:  Appropraite  Mood/Affect:  Appropriate  Depression Rating:  1  Anxiety Rating:  1  Thoughts of Suicide:  No  Will you contract for safety?   NA  Current AVH:  No  Plan for Discharge/Comments:  Patient attended discharge planning group and actively participated in group.  Patient will follow up with [facility] CSW provided all participants with daily workbook.   Transportation Means: Patient has transportation.   Supports:  Patient has a support system.   Terri Webster, Joesph July

## 2014-04-13 NOTE — [facility] Suicide Risk Assessment (Signed)
[facility] INPATIENT:  Family/Significant Other Suicide Prevention Education  Suicide Prevention Education:  Education Completed; Marella Chimes, Friend, [phone]; has been identified by the patient as the family member/significant other with whom the patient will be residing, and identified as the person(s) who will aid the patient in the event of a mental health crisis (suicidal ideations/suicide attempt).  With written consent from the patient, the family member/significant other has been provided the following suicide prevention education, prior to the and/or following the discharge of the patient.  The suicide prevention education provided includes the following:  Suicide risk factors  Suicide prevention and interventions  National Suicide Hotline telephone number  [facility] assessment telephone number  [facility] Emergency Assistance 911  [facility] and/or Residential Mobile Crisis Unit telephone number  Request made of family/significant other to:  Remove weapons (e.g., guns, rifles, knives), all items previously/currently identified as safety concern. Friend advised patient does not have access to weapons.    Remove drugs/medications (over-the-counter, prescriptions, illicit drugs), all items previously/currently identified as a safety concern.  The family member/significant other verbalizes understanding of the suicide prevention education information provided.  The family member/significant other agrees to remove the items of safety concern listed above.  Wynn Banker 04/13/2014, 12:29 PM

## 2014-04-13 NOTE — Progress Notes (Signed)
Adult Psychoeducational Group Note  Date:  04/13/2014 Time:  11:39 PM  Group Topic/Focus:  Wrap-Up Group:   The focus of this group is to help patients review their daily goal of treatment and discuss progress on daily workbooks.  Participation Level:  Active  Participation Quality:  Appropriate  Affect:  Appropriate  Cognitive:  Appropriate  Insight: Appropriate  Engagement in Group:  Engaged  Modes of Intervention:  Support  Additional Comments:  Pt stated that she had a pretty good day and that she is thankful to be here and that her goal for tomorrow is to feel better.   Rhona Fusilier 04/13/2014, 11:39 PM

## 2014-04-13 NOTE — Clinical Social Work Note (Signed)
CSW spoke with patient's friend.  She advised of patient being non-compliant with medications and having a fear that someone being out to get her.  Friend advised patient can stay with her for a few days after discharge.

## 2014-04-13 NOTE — Progress Notes (Signed)
Patient has ben up all night, pacing the hallway. She is been religiously preoccupied, bizarre, paranoid, manic and irritable. She refused her HS Risperdal and Elavil.  She was argumentative when Clinical research associate tried to explain the importance of her taking her medication to her. She seemed psychotic and need to be either do not admit or in 400 hall. She said staff and patients were trying to get her, she was also talking about end time., rude, intimidating and angry. Patient none receptive to encouragement or support. Room mate unable to sleep so staff have to put her room mate in the quiet room.

## 2014-04-13 NOTE — Progress Notes (Signed)
D: Patient has blunted affect and labile mood. She reported on the self inventory sheet that she's sleeping fair, appetite and ability to pay attention are both good and energy level is normal. Patient rates depression and feelings of hopelessness "2". She's participating in some, but not all groups. Patient is compliant with most medications; she refused Zoloft this morning. Earlier patient was pacing the hall and yelling aloud about how displease she is with staff here at the hospital.  A: Support and encouragement provided to patient. Scheduled medications administered per MD orders. Maintain Q15 minute checks for safety.  R: Patient receptive. Denies SI/HI/AVH. Patient remains safe.

## 2014-04-13 NOTE — B[facility] Group Notes (Signed)
[facility] LCSW Group Therapy  Emotional Regulation 1:15 - 2:30 PM  04/13/2014 3:08 PM  Type of Therapy:  Group Therapy  Participation Level:  Did Not Attend  Wynn Banker 04/13/2014, 3:08 PM

## 2014-04-13 NOTE — Progress Notes (Signed)
[facility] MD Progress Note  04/13/2014 2:25 PM Terri Webster  MRN:  [id number] Subjective:  Terri Webster wants to be D/C as states she feels threatened by staff. States they are talking  about her. Minimizes the Overdose. States that all her providers are in [place]. She wants out of here. Very agitated loud. States Elavil, Trazodone cause nightmares Diagnosis:   DSM5: Schizophrenia Disorders:  none Obsessive-Compulsive Disorders:  none Trauma-Stressor Disorders:  none Substance/Addictive Disorders:  none Depressive Disorders:  none Total Time spent with patient: 30 minutes  Axis I: Bipolar I Disorder  ADL's:  Intact  Sleep: Poor  Appetite:  Fair  Suicidal Ideation:  Plan:  denies Intent:  denies Means:  denies Homicidal Ideation:  Plan:  denies Intent:  denies Means:  denies AEB (as evidenced by):  Psychiatric Specialty Exam: Physical Exam  Review of Systems  Constitutional: Negative.   HENT: Negative.   Eyes: Negative.   Respiratory: Negative.   Cardiovascular: Negative.   Gastrointestinal: Negative.   Genitourinary: Negative.   Musculoskeletal: Negative.   Skin: Negative.   Neurological: Negative.   Endo/Heme/Allergies: Negative.   Psychiatric/Behavioral: The patient is nervous/anxious.     Blood pressure 109/75, pulse 88, temperature 97.5 F (36.4 C), temperature source Oral, resp. rate 18, height 5\' 7"  (1.702 m), weight 95.255 kg (210 lb).Body mass index is 32.88 kg/(m^2).  General Appearance: Fairly Groomed  Patent attorney::  Fair  Speech:  Clear and Coherent and Pressured  Volume:  fluctuates  Mood:  Angry, Irritable and suspicius, feeling threatened  Affect:  Labile  Thought Process:  Coherent and Goal Directed  Orientation:  Full (Time, Place, and Person)  Thought Content:  Ideas of Reference:   Paranoia and Paranoid Ideation  Suicidal Thoughts:  No  Homicidal Thoughts:  No  Memory:  Immediate;   Fair Recent;   Fair Remote;   Fair  Judgement:  Impaired   Insight:  Lacking  Psychomotor Activity:  Restlessness and agitated  Concentration:  Fair  Recall:  [employer] of Knowledge:NA  Language: Fair  Akathisia:  No  Handed:    AIMS (if indicated):     Assets:  Housing  Sleep:  Number of Hours: 1   Musculoskeletal: Strength & Muscle Tone: within normal limits Gait & Station: normal Patient leans: N/A  Current Medications: Current Facility-Administered Medications  Medication Dose Route Frequency Provider Last Rate Last Dose  . acetaminophen (TYLENOL) tablet 650 mg  650 mg Oral Q6H PRN Kerry Hough, PA-C      . alum & mag hydroxide-simeth (MAALOX/MYLANTA) 200-200-20 MG/5ML suspension 30 mL  30 mL Oral Q4H PRN Kerry Hough, PA-C      . amitriptyline (ELAVIL) tablet 25 mg  25 mg Oral QHS Kerry Hough, PA-C      . famotidine (PEPCID) tablet 20 mg  20 mg Oral BID Kerry Hough, PA-C   20 mg at 04/13/14 0835  . ibuprofen (ADVIL,MOTRIN) tablet 800 mg  800 mg Oral Q6H PRN Kerry Hough, PA-C   800 mg at 04/13/14 0936  . insulin aspart (novoLOG) injection 0-15 Units  0-15 Units Subcutaneous TID WC Kerry Hough, PA-C   3 Units at 04/13/14 1203  . insulin aspart (novoLOG) injection 0-5 Units  0-5 Units Subcutaneous QHS Kerry Hough, PA-C   2 Units at 04/12/14 2205  . insulin detemir (LEVEMIR) injection 35 Units  35 Units Subcutaneous QHS Kerry Hough, PA-C   35 Units at 04/12/14 2206  .  magnesium hydroxide (MILK OF MAGNESIA) suspension 30 mL  30 mL Oral Daily PRN Kerry HoughSpencer E Simon, PA-C      . metFORMIN (GLUCOPHAGE) tablet 1,000 mg  1,000 mg Oral Q breakfast Kerry HoughSpencer E Simon, PA-C   1,000 mg at 04/13/14 0835  . nicotine (NICODERM CQ - dosed in mg/24 hours) patch 21 mg  21 mg Transdermal Daily Nehemiah MassedFernando Cobos, MD   21 mg at 04/13/14 0836  . risperiDONE (RISPERDAL) tablet 3 mg  3 mg Oral QHS Nehemiah MassedFernando Cobos, MD      . sertraline (ZOLOFT) tablet 50 mg  50 mg Oral Daily Nehemiah MassedFernando Cobos, MD        Lab Results:  Results for orders  placed during the hospital encounter of 04/11/14 (from the past 48 hour(s))  GLUCOSE, CAPILLARY     Status: Abnormal   Collection Time    04/11/14  9:17 PM      Result Value Ref Range   Glucose-Capillary 306 (*) 70 - 99 mg/dL  GLUCOSE, CAPILLARY     Status: Abnormal   Collection Time    04/12/14  5:56 AM      Result Value Ref Range   Glucose-Capillary 209 (*) 70 - 99 mg/dL  GLUCOSE, CAPILLARY     Status: Abnormal   Collection Time    04/12/14 11:53 AM      Result Value Ref Range   Glucose-Capillary 223 (*) 70 - 99 mg/dL   Comment 1 Notify RN    GLUCOSE, CAPILLARY     Status: Abnormal   Collection Time    04/12/14  5:27 PM      Result Value Ref Range   Glucose-Capillary 225 (*) 70 - 99 mg/dL  GLUCOSE, CAPILLARY     Status: Abnormal   Collection Time    04/12/14  8:33 PM      Result Value Ref Range   Glucose-Capillary 212 (*) 70 - 99 mg/dL  GLUCOSE, CAPILLARY     Status: Abnormal   Collection Time    04/13/14  6:16 AM      Result Value Ref Range   Glucose-Capillary 158 (*) 70 - 99 mg/dL  GLUCOSE, CAPILLARY     Status: Abnormal   Collection Time    04/13/14 11:53 AM      Result Value Ref Range   Glucose-Capillary 184 (*) 70 - 99 mg/dL   Comment 1 Notify RN      Physical Findings: AIMS: Facial and Oral Movements Muscles of Facial Expression: None, normal Lips and Perioral Area: None, normal Jaw: None, normal Tongue: None, normal,Extremity Movements Upper (arms, wrists, hands, fingers): None, normal Lower (legs, knees, ankles, toes): None, normal, Trunk Movements Neck, shoulders, hips: None, normal, Overall Severity Severity of abnormal movements (highest score from questions above): None, normal Incapacitation due to abnormal movements: None, normal Patient's awareness of abnormal movements (rate only patient's report): No Awareness, Dental Status Current problems with teeth and/or dentures?: No Does patient usually wear dentures?: No  CIWA:  CIWA-Ar Total: 1 COWS:   COWS Total Score: 2  Treatment Plan Summary: Daily contact with patient to assess and evaluate symptoms and progress in treatment Medication management  Plan: Supportive approach/coping skills/improve reality testing           Optimize treatment with psychotropics           Consider and increase in the Risperdal  Medical Decision Making Problem Points:  Established problem, worsening (2) Data Points:  Review of medication regiment & side effects (  2)  I certify that inpatient services furnished can reasonably be expected to improve the patient's condition.   Lauretta Sallas A 04/13/2014, 2:25 PM

## 2014-04-13 NOTE — Progress Notes (Signed)
Patient ID: Terri Webster, female   DOB: Jan 22, 1966, 48 y.o.   MRN: [id number] D: pt. Agitated, delusional, tangential, when asked how her day went says "they still trying the same old junk, but I got people working on the outside, they working for me" pt. Continues to think staff is working against her. "they think you stupid" pt. Says staff is giggling about her. Pt. Fussing says her lotion has been removed from the room, slamming drawers "my God it was just lotion" A: Writer attempted to assure pt. Staff is available to assist her is we can, encouraged group and medications. R: Pt. Unreasonable will not listen to rationale, bushes past writer out of the room  writer is talking to her, mumbling about people working for her. Staff will monitor q12min for safety. R: Pt. Is safe on the unit, does not attend group. Pt. Refused 3 mg Risperdal "I want the 1 mg spongy one, that's the only one I'll takeCopy with [employer]. PA received order for pt request and administered accordingly.

## 2014-04-14 DIAGNOSIS — F332 Major depressive disorder, recurrent severe without psychotic features: Secondary | ICD-10-CM

## 2014-04-14 LAB — GLUCOSE, CAPILLARY
Glucose-Capillary: 203 mg/dL — ABNORMAL HIGH (ref 70–99)
Glucose-Capillary: 185 mg/dL — ABNORMAL HIGH (ref 70–99)

## 2014-04-14 MED ORDER — RANITIDINE HCL 150 MG PO TABS: 150.0000 mg | ORAL_TABLET | Freq: Two times a day (BID) | ORAL | Status: AC

## 2014-04-14 MED ORDER — RISPERIDONE 1 MG PO TABS: 1.0000 mg | ORAL_TABLET | Freq: Every day | ORAL | Status: AC

## 2014-04-14 MED ORDER — METFORMIN HCL 500 MG PO TABS: 1000.0000 mg | ORAL_TABLET | Freq: Every day | ORAL | Status: AC

## 2014-04-14 MED ORDER — AMITRIPTYLINE HCL 25 MG PO TABS: 25.0000 mg | ORAL_TABLET | Freq: Every day | ORAL | Status: AC

## 2014-04-14 MED ORDER — INSULIN DETEMIR 100 UNIT/ML ~~LOC~~ SOLN: 35.0000 [IU] | Freq: Every day | SUBCUTANEOUS | Status: AC

## 2014-04-14 MED ORDER — SERTRALINE HCL 50 MG PO TABS: 50.0000 mg | ORAL_TABLET | Freq: Every day | ORAL | Status: AC

## 2014-04-14 MED ORDER — GABAPENTIN 300 MG PO CAPS: 300.0000 mg | ORAL_CAPSULE | Freq: Three times a day (TID) | ORAL | Status: AC

## 2014-04-14 NOTE — BHH Suicide Risk Assessment (Signed)
Demographic Factors:  Low socioeconomic status and Unemployed  Total Time spent with patient: 30 minutes  Psychiatric Specialty Exam: Physical Exam  ROS  Blood pressure 111/73, pulse 78, temperature 97.4 F (36.3 C), temperature source Oral, resp. rate 16, height 5\' 7"  (1.702 m), weight 95.255 kg (210 lb).Body mass index is 32.88 kg/(m^2).  General Appearance: Guarded, but clearly improved compared to admission  Eye Contact::  Good  Speech:  Normal Rate  Volume:  Normal  Mood:  Irritable and vaguely irritable, but improved compared to admission- smiles anjd even laughs appropriately today during session  Affect:  improved range of affect  Thought Process:  Linear  Orientation:  Full (Time, Place, and Person)  Thought Content:  patient denies any hallucinations, no delusions noted- remains somewhat guarded today, but to lesser degree than upon admission- not as focused on feeling unsafe  Suicidal Thoughts:  No- denies any thoughts of hurting self or anyone else   Homicidal Thoughts:  No  Memory:  NA  Judgement:  Fair  Insight:  Fair  Psychomotor Activity:  Normal  Concentration:  Good  Recall:  Good  Fund of Knowledge:Good  Language: Good  Akathisia:  No  Handed:  Right  AIMS (if indicated):     Assets:  Desire for Improvement Resilience  Sleep:  Number of Hours: 0.25    Musculoskeletal: Strength & Muscle Tone: within normal limits Gait & Station: normal Patient leans: Right and Backward   Mental Status Per Nursing Assessment::   On Admission:  NA (recent )  Current Mental Status by Physician: At this time patient is not suicidal , not homicidal , although guarded, she is less irritable, with a fuller range of affect, she is not endorsing any hallucinations and no delusions are expressed. She  Is future oriented , wanting to get a car so she can eventually get employment again.   Loss Factors: Financial problems/change in socioeconomic status  Historical  Factors: Impulsivity  Risk Reduction Factors:   Positive social support and Positive coping skills or problem solving skills  Continued Clinical Symptoms:  Depression:   Impulsivity  Cognitive Features That Contribute To Risk:  Closed-mindedness    Suicide Risk:  Mild:  Suicidal ideation of limited frequency, intensity, duration, and specificity.  There are no identifiable plans, no associated intent, mild dysphoria and related symptoms, good self-control (both objective and subjective assessment), few other risk factors, and identifiable protective factors, including available and accessible social support.  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  Diabetes mellitus without complication    .  Hypertension    .  GERD (gastroesophageal reflux disease)    .  Anxiety    .  Neuropathic pain     AXIS IV: economic problems, housing problems, occupational problems and problems with primary support group  AXIS V: 50-55 currently    Plan Of Care/Follow-up recommendations:  Activity:  as tolerated Diet:  heart healthy, ADA diet ( has history of DM and HTN)  Tests:  NA Other:  As discussed with Child psychotherapist and patient, will [facility], and has appt on 6/16. Has an established outpatient MD,  Dr. Jacqulyn Bath, and a therapist.  Continue current medication regimen- patient denies any side effects.  Please note, patient requesting discharge- agreeing to follow up with outpatient provider for ongoing  treatment, no grounds for any involuntary commitment at this time, so agreed to discharge, as requested by patient.   Is patient on multiple antipsychotic therapies at discharge:  No   Has Patient had three or more failed trials of antipsychotic monotherapy by history:  No  Recommended Plan for Multiple Antipsychotic Therapies: NA    COBOS,  FERNANDO 04/14/2014, 1:39 PM

## 2014-04-14 NOTE — Progress Notes (Signed)
Adult Psychoeducational Group Note  Date:  04/14/2014 Time:  10:00AM  Group Topic/Focus:  Overcoming Stress:   The focus of this group is to define stress and help patients assess their triggers.  Participation Level:  Minimal  Participation Quality:  Appropriate  Affect:  Appropriate  Cognitive:  Appropriate  Insight: Appropriate  Engagement in Group:  Engaged  Modes of Intervention:  Discussion  Additional Comments:  Pt came into group a little late but was still willing to participate for the remainder of the group. Stated that her stressor was life itself just having a lot on her plate.   Terri Webster, Terresa Marlett E 04/14/2014, 10:42 AM
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
## 2014-04-14 NOTE — Progress Notes (Signed)
Discharge Note: Discharge instructions & prescriptions given to patient. Patient verbalized understanding of discharge instructions and prescriptions. Returned belongings to patient. Denies SI/HI/AVH. Patient d/c without incident to the front lobby.

## 2014-04-14 NOTE — Progress Notes (Signed)
[facility] Adult Case Management Discharge Plan :  Will you be returning to the same living situation after discharge: Yes,  Patient is discharging to friend's home. At discharge, do you have transportation home?:Yes,  Patient to arrange transportation home. Do you have the ability to pay for your medications:Yes,  Patient has Medicaid  Release of information consent forms completed and in the chart;  Patient's signature needed at discharge.  Patient to Follow up at: Follow-up Information   Follow up with [facility] On 04/19/2014. (Tuesday, April 19, 2014 at 2:30 PM.  Referral # [id number])    Contact information:   [address] [place], [place]   [address]  [phone]      Patient denies SI/HI:   Patient no longer endorsing SI/HI or other thoughts of self harm.   Safety Planning and Suicide Prevention discussed: .Reviewed with all patients during discharge planning group   Amulya Quintin, [place] July 04/14/2014, 10:19 AM

## 2014-04-18 NOTE — Progress Notes (Signed)
Patient Discharge Instructions:  After Visit Summary (AVS):   Faxed to:  04/18/14 Discharge Summary Note:   Faxed to:  04/18/14 Psychiatric Admission Assessment Note:   Faxed to:  04/18/14 Suicide Risk Assessment - Discharge Assessment:   Faxed to:  04/18/14 Faxed/Sent to the Next Level Care provider:  04/18/14 [facility] @ [phone]  Jerelene ReddenSheena E [place], 04/18/2014, 2:24 PM

## 2016-05-07 ENCOUNTER — Encounter: Payer: Self-pay | Admitting: Emergency Medicine

## 2016-05-07 ENCOUNTER — Emergency Department
Admission: EM | Admit: 2016-05-07 | Discharge: 2016-05-07 | Disposition: A | Discharge status: Home / Self Care | Payer: Medicaid Other | Attending: Emergency Medicine | Admitting: Emergency Medicine

## 2016-05-07 ENCOUNTER — Emergency Department: Payer: Medicaid Other

## 2016-05-07 DIAGNOSIS — J019 Acute sinusitis, unspecified: Secondary | ICD-10-CM | POA: Insufficient documentation

## 2016-05-07 DIAGNOSIS — F17210 Nicotine dependence, cigarettes, uncomplicated: Secondary | ICD-10-CM | POA: Insufficient documentation

## 2016-05-07 DIAGNOSIS — J209 Acute bronchitis, unspecified: Secondary | ICD-10-CM | POA: Diagnosis not present

## 2016-05-07 DIAGNOSIS — N393 Stress incontinence (female) (male): Secondary | ICD-10-CM

## 2016-05-07 DIAGNOSIS — Z79899 Other long term (current) drug therapy: Secondary | ICD-10-CM | POA: Diagnosis not present

## 2016-05-07 DIAGNOSIS — I10 Essential (primary) hypertension: Secondary | ICD-10-CM | POA: Diagnosis not present

## 2016-05-07 DIAGNOSIS — E119 Type 2 diabetes mellitus without complications: Secondary | ICD-10-CM | POA: Diagnosis not present

## 2016-05-07 DIAGNOSIS — Z794 Long term (current) use of insulin: Secondary | ICD-10-CM | POA: Diagnosis not present

## 2016-05-07 DIAGNOSIS — R0981 Nasal congestion: Secondary | ICD-10-CM | POA: Diagnosis present

## 2016-05-07 DIAGNOSIS — B9689 Other specified bacterial agents as the cause of diseases classified elsewhere: Secondary | ICD-10-CM

## 2016-05-07 HISTORY — DX: Bronchitis, not specified as acute or chronic: J40

## 2016-05-07 LAB — URINALYSIS COMPLETE WITH MICROSCOPIC (ARMC ONLY)
BACTERIA UA: NONE SEEN
Bilirubin Urine: NEGATIVE
Glucose, UA: >500 mg/dL — AB
HGB URINE DIPSTICK: NEGATIVE
Ketones, ur: NEGATIVE mg/dL
LEUKOCYTES UA: NEGATIVE
NITRITE: NEGATIVE
PROTEIN: NEGATIVE mg/dL
Specific Gravity, Urine: 1.032 — ABNORMAL HIGH (ref 1.005–1.030)
pH: 6 (ref 5.0–8.0)

## 2016-05-07 MED ORDER — METHYLPREDNISOLONE SODIUM SUCC 125 MG IJ SOLR
125.0000 mg | Freq: Once | INTRAMUSCULAR | Status: AC
  Administered 2016-05-07: 125 mg via INTRAMUSCULAR
  Filled 2016-05-07: qty 2

## 2016-05-07 MED ORDER — FLUTICASONE PROPIONATE 50 MCG/ACT NA SUSP: 1.0000 | Freq: Two times a day (BID) | NASAL | Status: AC

## 2016-05-07 MED ORDER — CETIRIZINE HCL 10 MG PO TABS
10.0000 mg | ORAL_TABLET | Freq: Every day | ORAL | Status: AC
Start: 2016-05-07 — End: ?

## 2016-05-07 MED ORDER — AMOXICILLIN-POT CLAVULANATE 875-125 MG PO TABS: 1.0000 | ORAL_TABLET | Freq: Two times a day (BID) | ORAL | Status: AC

## 2016-05-07 MED ORDER — PREDNISONE 50 MG PO TABS: 50.0000 mg | ORAL_TABLET | Freq: Every day | ORAL | Status: AC

## 2016-05-07 MED ORDER — ALBUTEROL SULFATE (2.5 MG/3ML) 0.083% IN NEBU
2.5000 mg | INHALATION_SOLUTION | Freq: Once | RESPIRATORY_TRACT | Status: AC
  Administered 2016-05-07: 2.5 mg via RESPIRATORY_TRACT
  Filled 2016-05-07: qty 3

## 2016-05-07 NOTE — ED Notes (Signed)
Patient transported to X-ray 

## 2016-05-07 NOTE — Discharge Instructions (Signed)
Acute Bronchitis Bronchitis is inflammation of the airways that extend from the windpipe into the lungs (bronchi). The inflammation often causes mucus to develop. This leads to a cough, which is the most common symptom of bronchitis.  In acute bronchitis, the condition usually develops suddenly and goes away over time, usually in a couple weeks. Smoking, allergies, and asthma can make bronchitis worse. Repeated episodes of bronchitis may cause further lung problems.  CAUSES Acute bronchitis is most often caused by the same virus that causes a cold. The virus can spread from person to person (contagious) through coughing, sneezing, and touching contaminated objects. SIGNS AND SYMPTOMS   Cough.   Fever.   Coughing up mucus.   Body aches.   Chest congestion.   Chills.   Shortness of breath.   Sore throat.  DIAGNOSIS  Acute bronchitis is usually diagnosed through a physical exam. Your health care provider will also ask you questions about your medical history. Tests, such as chest X-rays, are sometimes done to rule out other conditions.  TREATMENT  Acute bronchitis usually goes away in a couple weeks. Oftentimes, no medical treatment is necessary. Medicines are sometimes given for relief of fever or cough. Antibiotic medicines are usually not needed but may be prescribed in certain situations. In some cases, an inhaler may be recommended to help reduce shortness of breath and control the cough. A cool mist vaporizer may also be used to help thin bronchial secretions and make it easier to clear the chest.  HOME CARE INSTRUCTIONS  Get plenty of rest.   Drink enough fluids to keep your urine clear or pale yellow (unless you have a medical condition that requires fluid restriction). Increasing fluids may help thin your respiratory secretions (sputum) and reduce chest congestion, and it will prevent dehydration.   Take medicines only as directed by your health care provider.  If  you were prescribed an antibiotic medicine, finish it all even if you start to feel better.  Avoid smoking and secondhand smoke. Exposure to cigarette smoke or irritating chemicals will make bronchitis worse. If you are a smoker, consider using nicotine gum or skin patches to help control withdrawal symptoms. Quitting smoking will help your lungs heal faster.   Reduce the chances of another bout of acute bronchitis by washing your hands frequently, avoiding people with cold symptoms, and trying not to touch your hands to your mouth, nose, or eyes.   Keep all follow-up visits as directed by your health care provider.  SEEK MEDICAL CARE IF: Your symptoms do not improve after 1 week of treatment.  SEEK IMMEDIATE MEDICAL CARE IF:  You develop an increased fever or chills.   You have chest pain.   You have severe shortness of breath.  You have bloody sputum.   You develop dehydration.  You faint or repeatedly feel like you are going to pass out.  You develop repeated vomiting.  You develop a severe headache. MAKE SURE YOU:   Understand these instructions.  Will watch your condition.  Will get help right away if you are not doing well or get worse.   This information is not intended to replace advice given to you by your health care provider. Make sure you discuss any questions you have with your health care provider.   Document Released: 11/28/2004 Document Revised: 11/11/2014 Document Reviewed: 04/13/2013 Elsevier Interactive Patient Education 2016 Else[employer] Inc[employer] Sinusitis, Adult Sinusitis is redness, soreness, and inflammation of the paranasal sinuses. Paranasal sinuses are air pockets within the   bones of your face. They are located beneath your eyes, in the middle of your forehead, and above your eyes. In healthy paranasal sinuses, mucus is able to drain out, and air is able to circulate through them by way of your nose. However, when your paranasal sinuses are inflamed,  mucus and air can become trapped. This can allow bacteria and other germs to grow and cause infection. Sinusitis can develop quickly and last only a short time (acute) or continue over a long period (chronic). Sinusitis that lasts for more than 12 weeks is considered chronic. CAUSES Causes of sinusitis include:  Allergies.  Structural abnormalities, such as displacement of the cartilage that separates your nostrils (deviated septum), which can decrease the air flow through your nose and sinuses and affect sinus drainage.  Functional abnormalities, such as when the small hairs (cilia) that line your sinuses and help remove mucus do not work properly or are not present. SIGNS AND SYMPTOMS Symptoms of acute and chronic sinusitis are the same. The primary symptoms are pain and pressure around the affected sinuses. Other symptoms include:  Upper toothache.  Earache.  Headache.  Bad breath.  Decreased sense of smell and taste.  A cough, which worsens when you are lying flat.  Fatigue.  Fever.  Thick drainage from your nose, which often is green and may contain pus (purulent).  Swelling and warmth over the affected sinuses. DIAGNOSIS Your health care provider will perform a physical exam. During your exam, your health care provider may perform any of the following to help determine if you have acute sinusitis or chronic sinusitis:  Look in your nose for signs of abnormal growths in your nostrils (nasal polyps).  Tap over the affected sinus to check for signs of infection.  View the inside of your sinuses using an imaging device that has a light attached (endoscope). If your health care provider suspects that you have chronic sinusitis, one or more of the following tests may be recommended:  Allergy tests.  Nasal culture. A sample of mucus is taken from your nose, sent to a lab, and screened for bacteria.  Nasal cytology. A sample of mucus is taken from your nose and examined by  your health care provider to determine if your sinusitis is related to an allergy. TREATMENT Most cases of acute sinusitis are related to a viral infection and will resolve on their own within 10 days. Sometimes, medicines are prescribed to help relieve symptoms of both acute and chronic sinusitis. These may include pain medicines, decongestants, nasal steroid sprays, or saline sprays. However, for sinusitis related to a bacterial infection, your health care provider will prescribe antibiotic medicines. These are medicines that will help kill the bacteria causing the infection. Rarely, sinusitis is caused by a fungal infection. In these cases, your health care provider will prescribe antifungal medicine. For some cases of chronic sinusitis, surgery is needed. Generally, these are cases in which sinusitis recurs more than 3 times per year, despite other treatments. HOME CARE INSTRUCTIONS  Drink plenty of water. Water helps thin the mucus so your sinuses can drain more easily.  Use a humidifier.  Inhale steam 3-4 times a day (for example, sit in the bathroom with the shower running).  Apply a warm, moist washcloth to your face 3-4 times a day, or as directed by your health care provider.  Use saline nasal sprays to help moisten and clean your sinuses.  Take medicines only as directed by your health care provider.  If   you were prescribed either an antibiotic or antifungal medicine, finish it all even if you start to feel better. SEEK IMMEDIATE MEDICAL CARE IF:  You have increasing pain or severe headaches.  You have nausea, vomiting, or drowsiness.  You have swelling around your face.  You have vision problems.  You have a stiff neck.  You have difficulty breathing.   This information is not intended to replace advice given to you by your health care provider. Make sure you discuss any questions you have with your health care provider.   Document Released: 10/21/2005 Document  Revised: 11/11/2014 Document Reviewed: 11/05/2011 Elsevier Interactive Patient Education 2016 [employer] Inc[employer]  

## 2016-05-07 NOTE — ED Provider Notes (Signed)
[facility] Emergency Department Provider Note  ____________________________________________  Time seen: Approximately 10:35 PM  I have reviewed the triage vital signs and the nursing notes.   HISTORY  Chief Complaint Nasal Congestion and Cough    HPI Terri Webster is a 50 y.o. female presents emergency department complaining of nasal congestion, sinus pressure, cough, shortness of breath, urinary incontinence times several weeks. Patient states that symptoms of nasal congestion, sinus pressure, cough, shortness of breath and ongoing constantly 1 week. Patient reports that urinary incontinence is intermittent and typically while stepping off curbs, coughing fits, or sneezing. Patient denies any fevers or chills, headache, visual changes, chest pain, abdominal pain, nausea or vomiting. Patient has had no medications for these complaints prior to arrival. Patient does have a history of diabetes, high blood pressure, bronchitis, asthma.   Past Medical History  Diagnosis Date  . Diabetes mellitus without complication (HCC)   . Hypertension   . GERD (gastroesophageal reflux disease)   . Anxiety   . Neuropathic pain   . Bronchitis     Patient Active Problem List   Diagnosis Date Noted  . Protein-calorie malnutrition, severe (HCC) 04/11/2014  . Bipolar 1 disorder, depressed, moderate (HCC) 04/11/2014  . Drug overdose, multiple drugs 04/10/2014  . Overdose 04/10/2014  . Diabetes mellitus (HCC) 04/10/2014  . Hypertension     Past Surgical History  Procedure Laterality Date  . Cesarean section      Current Outpatient Rx  Name  Route  Sig  Dispense  Refill  . amitriptyline (ELAVIL) 25 MG tablet   Oral   Take 1 tablet (25 mg total) by mouth at bedtime.   30 tablet   0   . amoxicillin-clavulanate (AUGMENTIN) 875-125 MG tablet   Oral   Take 1 tablet by mouth 2 (two) times daily.   14 tablet   0   . cetirizine (ZYRTEC) 10 MG tablet   Oral   Take  1 tablet (10 mg total) by mouth daily.   30 tablet   0   . fluticasone (FLONASE) 50 MCG/ACT nasal spray   Each Nare   Place 1 spray into both nostrils 2 (two) times daily.   16 g   0   . gabapentin (NEURONTIN) 300 MG capsule   Oral   Take 1 capsule (300 mg total) by mouth 3 (three) times daily.   90 capsule   0   . ibuprofen (ADVIL,MOTRIN) 800 MG tablet   Oral   Take 800 mg by mouth every 6 (six) hours as needed for moderate pain.          Marland Kitchen. insulin detemir (LEVEMIR) 100 UNIT/ML injection   Subcutaneous   Inject 0.35 mLs (35 Units total) into the skin at bedtime.   10 mL   11   . metFORMIN (GLUCOPHAGE) 500 MG tablet   Oral   Take 2 tablets (1,000 mg total) by mouth daily with breakfast.         . predniSONE (DELTASONE) 50 MG tablet   Oral   Take 1 tablet (50 mg total) by mouth daily with breakfast.   5 tablet   0   . ranitidine (ZANTAC) 150 MG tablet   Oral   Take 1 tablet (150 mg total) by mouth 2 (two) times daily.         . risperiDONE (RISPERDAL) 1 MG tablet   Oral   Take 1 tablet (1 mg total) by mouth daily.   30 tablet  0   . sertraline (ZOLOFT) 50 MG tablet   Oral   Take 1 tablet (50 mg total) by mouth daily.   30 tablet   0     Allergies Review of patient's allergies indicates no known allergies.  History reviewed. No pertinent family history.  Social History Social History  Substance Use Topics  . Smoking status: Current Every Day Smoker -- 1.00 packs/day    Types: Cigarettes  . Smokeless tobacco: None  . Alcohol Use: No     Review of Systems  Constitutional: No fever/chills Eyes: No visual changes. No discharge ENT: Positive for nasal congestion and sinus pressure. Cardiovascular: no chest pain. Respiratory: Positive cough. Positive SOB. Gastrointestinal: No abdominal pain.  No nausea, no vomiting.  No diarrhea.  No constipation. Genitourinary: Positive for intermittent urinary incontinence Musculoskeletal: Negative for  musculoskeletal pain. Skin: Negative for rash, abrasions, lacerations, ecchymosis. Neurological: Negative for headaches, focal weakness or numbness. 10-point ROS otherwise negative.  ____________________________________________   PHYSICAL EXAM:  VITAL SIGNS: ED Triage Vitals  Enc Vitals Group     BP 05/07/16 2203 160/68 mmHg     Pulse Rate 05/07/16 2203 91     Resp 05/07/16 2203 20     Temp 05/07/16 2203 98.8 F (37.1 C)     Temp Source 05/07/16 2203 Oral     SpO2 05/07/16 2203 94 %     Weight 05/07/16 2203 218 lb (98.884 kg)     Height 05/07/16 2203 5\' 6"  (1.676 m)     Head Cir --      Peak Flow --      Pain Score --      Pain Loc --      Pain Edu? --      Excl. in GC? --      Constitutional: Alert and oriented. Well appearing and in no acute distress. Eyes: Conjunctivae are normal. PERRL. EOMI. Head: Atraumatic. ENT:      Ears: EACs and TMs unremarkable bilaterally.      Nose: Moderate purulent congestion/rhinnorhea. Patient is tender to percussion over the frontal or maxillary sinuses.      Mouth/Throat: Mucous membranes are moist. Pharynx is mildly erythematous but nonedematous. Uvula is midline. Tonsils are unremarkable bilaterally. Neck: No stridor. Neck is supple with full range of motion  Cardiovascular: Normal rate, regular rhythm. Normal S1 and S2.  Good peripheral circulation. Respiratory: Normal respiratory effort without tachypnea or retractions. Lungs with diffuse expiratory wheezing.Peri [place]. Good air entry to the bases with no decreased or absent breath sounds. Gastrointestinal: Bowel sounds 4 quadrants. Soft and nontender to palpation. No guarding or rigidity. No palpable masses. No distention. No CVA tenderness. Musculoskeletal: Full range of motion to all extremities. No gross deformities appreciated. Neurologic:  Normal speech and language. No gross focal neurologic deficits are appreciated.  Skin:  Skin is warm, dry and intact. No rash noted. Psychiatric:  Mood and affect are normal. Speech and behavior are normal. Patient exhibits appropriate insight and judgement.   ____________________________________________   LABS (all labs ordered are listed, but only abnormal results are displayed)  Labs Reviewed  URINALYSIS COMPLETEWITH MICROSCOPIC ([facility] ONLY) - Abnormal; Notable for the following:    Color, Urine YELLOW (*)    APPearance CLEAR (*)    Glucose, UA >500 (*)    Specific Gravity, Urine 1.032 (*)    Squamous Epithelial / LPF 0-5 (*)    All other components within normal limits   ____________________________________________  EKG   ____________________________________________  RADIOLOGY I, Delorise Royals Cuthriell, personally viewed and evaluated these images (plain radiographs) as part of my medical decision making, as well as reviewing the written report by the radiologist.  Dg Chest 2 View  05/07/2016  CLINICAL DATA:  Coughing congestion, 1 week duration. Productive cough. Balance disturbance. EXAM: CHEST  2 VIEW COMPARISON:  03/22/2004 FINDINGS: Heart size is normal. Calcification in the ascending aorta indicates aortic atherosclerosis. Pulmonary vascularity is normal. The lungs are clear. Chronic degenerative changes affect the spine. IMPRESSION: No active disease. Chronic spondylosis. Aortic atherosclerosis. Electronically Signed   By: Paulina Fusi M.D.   On: 05/07/2016 22:32    ____________________________________________    PROCEDURES  Procedure(s) performed:       Medications  albuterol (PROVENTIL) (2.5 MG/3ML) 0.083% nebulizer solution 2.5 mg (2.5 mg Nebulization Given 05/07/16 2235)  methylPREDNISolone sodium succinate (SOLU-MEDROL) 125 mg/2 mL injection 125 mg (125 mg Intramuscular Given 05/07/16 2235)     ____________________________________________   INITIAL IMPRESSION / ASSESSMENT AND PLAN / ED COURSE  Pertinent labs & imaging results that were available during my care of the patient were reviewed by me and  considered in my medical decision making (see chart for details).  Patient's diagnosis is consistent with Acute bacterial sinusitis, acute bronchitis, stress incontinence. Patient presents emergency Department with multiple complaints. Patient had expiratory wheezing upon auscultation. Treatment with Solu-Medrol and albuterol reveals good clinical improvement. Patient will be discharged home with antibiotics, and Flonase, Zyrtec, steroids. Patient's urinary incontinence is most consistent with weakening of the plantar muscles due to age and multiple pregnancies. No signs of UTI on urinalysis. Patient does have glucose and urine which is consistent with previous urinalysis from poorly controlled diabetes. Patient does have Patient may follow up with urology if this continues to be concerned.Patient is to follow up with primary care provider as needed or otherwise directed. Patient is given ED precautions to return to the ED for any worsening or new symptoms.     ____________________________________________  FINAL CLINICAL IMPRESSION(S) / ED DIAGNOSES  Final diagnoses:  Acute bacterial sinusitis  Acute bronchitis, unspecified organism  Stress incontinence      NEW MEDICATIONS STARTED DURING THIS VISIT:  New Prescriptions   AMOXICILLIN-CLAVULANATE (AUGMENTIN) 875-125 MG TABLET    Take 1 tablet by mouth 2 (two) times daily.   CETIRIZINE (ZYRTEC) 10 MG TABLET    Take 1 tablet (10 mg total) by mouth daily.   FLUTICASONE (FLONASE) 50 MCG/ACT NASAL SPRAY    Place 1 spray into both nostrils 2 (two) times daily.   PREDNISONE (DELTASONE) 50 MG TABLET    Take 1 tablet (50 mg total) by mouth daily with breakfast.        This chart was dictated using voice recognition software/Dragon. Despite best efforts to proofread, errors can occur which can change the meaning. Any change was purely unintentional.    Racheal Patches, PA-C 05/07/16 1610  Jeanmarie Plant, MD 05/08/16 [phone]

## 2016-05-07 NOTE — ED Notes (Signed)
Pt to triage with c/o congested cough for 1 week, pt reports yellow and green sputum (productive cough in triage), pt also c/o: "food not tasting right", being incontinent when "stepping off curbs", pt reports these s/sx worse over the last week.  Pt lung fields are diminished but clear.  SOB with exertion.    Pt reports hx of DM, HTN, and bronchitis.

## 2016-09-19 ENCOUNTER — Encounter: Payer: Self-pay | Admitting: Emergency Medicine

## 2016-09-19 ENCOUNTER — Emergency Department
Admission: EM | Admit: 2016-09-19 | Discharge: 2016-09-19 | Disposition: A | Discharge status: Home / Self Care | Payer: Medicare Other | Attending: Emergency Medicine | Admitting: Emergency Medicine

## 2016-09-19 DIAGNOSIS — I10 Essential (primary) hypertension: Secondary | ICD-10-CM | POA: Diagnosis not present

## 2016-09-19 DIAGNOSIS — L0231 Cutaneous abscess of buttock: Secondary | ICD-10-CM | POA: Diagnosis not present

## 2016-09-19 DIAGNOSIS — E119 Type 2 diabetes mellitus without complications: Secondary | ICD-10-CM | POA: Insufficient documentation

## 2016-09-19 DIAGNOSIS — Z79899 Other long term (current) drug therapy: Secondary | ICD-10-CM | POA: Diagnosis not present

## 2016-09-19 DIAGNOSIS — F17210 Nicotine dependence, cigarettes, uncomplicated: Secondary | ICD-10-CM | POA: Insufficient documentation

## 2016-09-19 DIAGNOSIS — Z794 Long term (current) use of insulin: Secondary | ICD-10-CM | POA: Insufficient documentation

## 2016-09-19 DIAGNOSIS — L0291 Cutaneous abscess, unspecified: Secondary | ICD-10-CM

## 2016-09-19 MED ORDER — ACETAMINOPHEN 500 MG PO TABS
1000.0000 mg | ORAL_TABLET | Freq: Once | ORAL | Status: AC
  Administered 2016-09-19: 1000 mg via ORAL
  Filled 2016-09-19: qty 2

## 2016-09-19 MED ORDER — OXYCODONE HCL 5 MG PO TABS
5.0000 mg | ORAL_TABLET | Freq: Once | ORAL | Status: AC
  Administered 2016-09-19: 5 mg via ORAL
  Filled 2016-09-19: qty 1

## 2016-09-19 MED ORDER — LIDOCAINE-EPINEPHRINE (PF) 1 %-1:200000 IJ SOLN
30.0000 mL | Freq: Once | INTRAMUSCULAR | Status: DC
  Filled 2016-09-19: qty 30

## 2016-09-19 MED ORDER — SULFAMETHOXAZOLE-TRIMETHOPRIM 800-160 MG PO TABS: 1.0000 | ORAL_TABLET | Freq: Two times a day (BID) | ORAL | 0 refills | Status: AC

## 2016-09-19 MED ORDER — HYDROCODONE-ACETAMINOPHEN 5-325 MG PO TABS: 1.0000 | ORAL_TABLET | ORAL | 0 refills | Status: AC | PRN

## 2016-09-19 NOTE — ED Provider Notes (Signed)
[facility]-EMERGENCY DEPARTMENT Provider Note   CSN: [id number] Arrival date & time: 09/19/16  1953     History   Chief Complaint Chief Complaint  Patient presents with  . Abscess    HPI Terri Webster is a 50 y.o. female presents to the emergency department for evaluation of left buttocks abscess. She's had swelling and pain for 4-5 days. Subjective low-grade fevers. Pain is moderate 8 out of 10 she has not taken any medications for pain. She denies any history of boils or abscesses. Pain is increased to touch and with sitting. She's not had any drainage. No rectal pain.  HPI  Past Medical History:  Diagnosis Date  . Anxiety   . Bronchitis   . Diabetes mellitus without complication (HCC)   . GERD (gastroesophageal reflux disease)   . Hypertension   . Neuropathic pain     Patient Active Problem List   Diagnosis Date Noted  . Protein-calorie malnutrition, severe (HCC) 04/11/2014  . Bipolar 1 disorder, depressed, moderate (HCC) 04/11/2014  . Drug overdose, multiple drugs 04/10/2014  . Overdose 04/10/2014  . Diabetes mellitus (HCC) 04/10/2014  . Hypertension     Past Surgical History:  Procedure Laterality Date  . CESAREAN SECTION      OB History    No data available       Home Medications    Prior to Admission medications   Medication Sig Start Date End Date Taking? Authorizing Provider  amitriptyline (ELAVIL) 25 MG tablet Take 1 tablet (25 mg total) by mouth at bedtime. 04/14/14   Beau FannyJohn C Withrow, FNP  amoxicillin-clavulanate (AUGMENTIN) 875-125 MG tablet Take 1 tablet by mouth 2 (two) times daily. 05/07/16   Delorise RoyalsJonathan D Cuthriell, PA-C  cetirizine (ZYRTEC) 10 MG tablet Take 1 tablet (10 mg total) by mouth daily. 05/07/16   Delorise RoyalsJonathan D Cuthriell, PA-C  fluticasone (FLONASE) 50 MCG/ACT nasal spray Place 1 spray into both nostrils 2 (two) times daily. 05/07/16   Delorise RoyalsJonathan D Cuthriell, PA-C  gabapentin (NEURONTIN) 300 MG capsule Take 1 capsule (300 mg total) by mouth 3  (three) times daily. 04/14/14   Beau FannyJohn C Withrow, FNP  HYDROcodone-acetaminophen (NORCO) 5-325 MG tablet Take 1 tablet by mouth every 4 (four) hours as needed for moderate pain. 09/19/16   Evon Slackhomas C Gaines, PA-C  ibuprofen (ADVIL,MOTRIN) 800 MG tablet Take 800 mg by mouth every 6 (six) hours as needed for moderate pain.     Historical Provider, MD  insulin detemir (LEVEMIR) 100 UNIT/ML injection Inject 0.35 mLs (35 Units total) into the skin at bedtime. 04/14/14   Beau FannyJohn C Withrow, FNP  metFORMIN (GLUCOPHAGE) 500 MG tablet Take 2 tablets (1,000 mg total) by mouth daily with breakfast. 04/14/14   Beau FannyJohn C Withrow, FNP  predniSONE (DELTASONE) 50 MG tablet Take 1 tablet (50 mg total) by mouth daily with breakfast. 05/07/16   Delorise RoyalsJonathan D Cuthriell, PA-C  ranitidine (ZANTAC) 150 MG tablet Take 1 tablet (150 mg total) by mouth 2 (two) times daily. 04/14/14   Beau FannyJohn C Withrow, FNP  risperiDONE (RISPERDAL) 1 MG tablet Take 1 tablet (1 mg total) by mouth daily. 04/14/14   Beau FannyJohn C Withrow, FNP  sertraline (ZOLOFT) 50 MG tablet Take 1 tablet (50 mg total) by mouth daily. 04/14/14   Beau FannyJohn C Withrow, FNP  sulfamethoxazole-trimethoprim (BACTRIM DS,SEPTRA DS) 800-160 MG tablet Take 1 tablet by mouth 2 (two) times daily. 09/19/16   Evon Slackhomas C Gaines, PA-C    Family History History reviewed. No pertinent family history.  Social History  Social History  Substance Use Topics  . Smoking status: Current Every Day Smoker    Packs/day: 1.00    Types: Cigarettes  . Smokeless tobacco: Never Used  . Alcohol use No     Allergies   Patient has no known allergies.   Review of Systems Review of Systems  Constitutional: Negative for activity change, chills, fatigue and fever.  HENT: Negative for congestion, sinus pressure and sore throat.   Eyes: Negative for visual disturbance.  Respiratory: Negative for cough, chest tightness and shortness of breath.   Cardiovascular: Negative for chest pain and leg swelling.  Gastrointestinal:  Negative for abdominal pain, diarrhea, nausea and vomiting.  Genitourinary: Negative for dysuria.  Musculoskeletal: Negative for arthralgias and gait problem.  Skin: Positive for wound. Negative for rash.  Neurological: Negative for weakness, numbness and headaches.  Hematological: Negative for adenopathy.  Psychiatric/Behavioral: Negative for agitation, behavioral problems and confusion.     Physical Exam Updated Vital Signs BP 130/71 (BP Location: Left Arm)   Pulse (!) 108   Temp 99.1 F (37.3 C) (Oral)   Resp 18   Ht 5\' 6"  (1.676 m)   Wt 90.7 kg   SpO2 94%   BMI 32.28 kg/m   Physical Exam  Constitutional: She is oriented to person, place, and time. She appears well-developed and well-nourished. No distress.  HENT:  Head: Normocephalic and atraumatic.  Mouth/Throat: Oropharynx is clear and moist.  Eyes: EOM are normal. Pupils are equal, round, and reactive to light. Right eye exhibits no discharge. Left eye exhibits no discharge.  Neck: Normal range of motion. Neck supple.  Cardiovascular: Normal rate, regular rhythm and intact distal pulses.   Pulmonary/Chest: Effort normal and breath sounds normal. No respiratory distress. She exhibits no tenderness.  Abdominal: Soft. She exhibits no distension and no mass. There is no tenderness. There is no rebound and no guarding. No hernia.  Musculoskeletal: Normal range of motion. She exhibits no edema.  Neurological: She is alert and oriented to person, place, and time. She has normal reflexes.  Skin: Skin is warm and dry.  Examination of the buttocks shows left buttocks abscess, 4 cm in diameter, hard indurated with mild fluctuance. Has come to ahead in the center of the area of induration. There is no tracking of induration along the rectum. She is nontender along the rectum no signs of external hemorrhoids.  Psychiatric: She has a normal mood and affect. Her behavior is normal. Thought content normal.     ED Treatments / Results    Labs (all labs ordered are listed, but only abnormal results are displayed) Labs Reviewed - No data to display  EKG  EKG Interpretation None       Radiology No results found.  Procedures Procedures (including critical care time) INCISION AND DRAINAGE Performed by: Patience MuscaGAINES, THOMAS CHRISTOPHER Consent: Verbal consent obtained. Risks and benefits: risks, benefits and alternatives were discussed Type: abscess  Body area: Left buttocks  Anesthesia: local infiltration  Incision was made with a scalpel.  Local anesthetic: lidocaine 1% with epinephrine  Anesthetic total: 5 ml  Complexity: complex Blunt dissection to break up loculations  Drainage: purulent  Drainage amount: 10 cc   Packing material: 1/4 in iodoform gauze  Patient tolerance: Patient tolerated the procedure well with no immediate complications.    Medications Ordered in ED Medications  lidocaine-EPINEPHrine (XYLOCAINE-EPINEPHrine) 1 %-1:200000 (PF) injection 30 mL (not administered)  oxyCODONE (Oxy IR/ROXICODONE) immediate release tablet 5 mg (5 mg Oral Given 09/19/16 2057)  acetaminophen (TYLENOL) tablet 1,000 mg (1,000 mg Oral Given 09/19/16 2057)     Initial Impression / Assessment and Plan / ED Course  I have reviewed the triage vital signs and the nursing notes.  Pertinent labs & imaging results that were available during my care of the patient were reviewed by me and considered in my medical decision making (see chart for details).  Clinical Course     50 year old female with left buttocks abscess, does not appear to track to the perirectal region. Abscess is indurated and fluctuant, incision and drainage was performed with packing of iodoform. Patient tolerated procedure well. There is significant reduction in pain with significant drainage present. Patient will follow-up in 2-3 days for recheck and wound packing removal and possible repacking if needed. Bactrim DS prescribed.  Final  Clinical Impressions(s) / ED Diagnoses   Final diagnoses:  Abscess  Abscess of buttock, left    New Prescriptions New Prescriptions   HYDROCODONE-ACETAMINOPHEN (NORCO) 5-325 MG TABLET    Take 1 tablet by mouth every 4 (four) hours as needed for moderate pain.   SULFAMETHOXAZOLE-TRIMETHOPRIM (BACTRIM DS,SEPTRA DS) 800-160 MG TABLET    Take 1 tablet by mouth 2 (two) times daily.     Evon Slack, PA-C 09/19/16 2154    Charlynne Pander, MD 09/19/16 [phone]

## 2016-09-19 NOTE — ED Notes (Signed)
Xylocaine given to Poplar Plain[place]ris, [place].

## 2016-09-19 NOTE — Discharge Instructions (Signed)
Please take medications as prescribed. Follow-up with your primary care physician or, walking clinic, emergency department 2-3 days for recheck. Return to the ER sooner for any fevers increase in pain worsening symptoms urgent changes in her health.

## 2016-09-19 NOTE — ED Triage Notes (Signed)
Pt to ED c/o abscess to left buttocks x4 days, denies drainage, denies popping abscess.  Pt reports chills at home.

## 2021-06-26 DIAGNOSIS — Z7984 Long term (current) use of oral hypoglycemic drugs: Secondary | ICD-10-CM | POA: Insufficient documentation

## 2021-06-26 DIAGNOSIS — I10 Essential (primary) hypertension: Secondary | ICD-10-CM | POA: Insufficient documentation

## 2021-06-26 DIAGNOSIS — E119 Type 2 diabetes mellitus without complications: Secondary | ICD-10-CM | POA: Diagnosis not present

## 2021-06-26 DIAGNOSIS — F17210 Nicotine dependence, cigarettes, uncomplicated: Secondary | ICD-10-CM | POA: Diagnosis not present

## 2021-06-26 DIAGNOSIS — Z79899 Other long term (current) drug therapy: Secondary | ICD-10-CM | POA: Diagnosis not present

## 2021-06-26 DIAGNOSIS — Z794 Long term (current) use of insulin: Secondary | ICD-10-CM | POA: Diagnosis not present

## 2021-06-26 DIAGNOSIS — M7989 Other specified soft tissue disorders: Secondary | ICD-10-CM | POA: Diagnosis not present

## 2021-06-27 ENCOUNTER — Other Ambulatory Visit: Payer: Self-pay

## 2021-06-27 ENCOUNTER — Emergency Department (HOSPITAL_BASED_OUTPATIENT_CLINIC_OR_DEPARTMENT_OTHER): Payer: Medicare Other

## 2021-06-27 ENCOUNTER — Emergency Department (HOSPITAL_COMMUNITY): Payer: Medicare Other

## 2021-06-27 ENCOUNTER — Emergency Department (HOSPITAL_COMMUNITY)
Admission: EM | Admit: 2021-06-27 | Discharge: 2021-06-27 | Disposition: A | Discharge status: Home / Self Care | Payer: Medicare Other | Attending: Emergency Medicine | Admitting: Emergency Medicine

## 2021-06-27 DIAGNOSIS — M7989 Other specified soft tissue disorders: Secondary | ICD-10-CM | POA: Diagnosis not present

## 2021-06-27 DIAGNOSIS — R609 Edema, unspecified: Secondary | ICD-10-CM | POA: Diagnosis not present

## 2021-06-27 LAB — BASIC METABOLIC PANEL
Anion gap: 6 (ref 5–15)
BUN: 10 mg/dL (ref 6–20)
CO2: 28 mmol/L (ref 22–32)
Calcium: 8.8 mg/dL — ABNORMAL LOW (ref 8.9–10.3)
Chloride: 101 mmol/L (ref 98–111)
Creatinine, Ser: 0.69 mg/dL (ref 0.44–1.00)
GFR, Estimated: >60 mL/min (ref >60)
Glucose, Bld: 315 mg/dL — ABNORMAL HIGH (ref 70–99)
Potassium: 4.3 mmol/L (ref 3.5–5.1)
Sodium: 135 mmol/L (ref 135–145)

## 2021-06-27 LAB — CBC WITH DIFFERENTIAL/PLATELET
Abs Immature Granulocytes: 0.07 10*3/uL (ref 0.00–0.07)
Basophils Absolute: 0 10*3/uL (ref 0.0–0.1)
Basophils Relative: 1 %
Eosinophils Absolute: 0.1 10*3/uL (ref 0.0–0.5)
Eosinophils Relative: 1 %
HCT: 44.5 % (ref 36.0–46.0)
Hemoglobin: 13.9 g/dL (ref 12.0–15.0)
Immature Granulocytes: 1 %
Lymphocytes Relative: 15 %
Lymphs Abs: 1.2 10*3/uL (ref 0.7–4.0)
MCH: 30.8 pg (ref 26.0–34.0)
MCHC: 31.2 g/dL (ref 30.0–36.0)
MCV: 98.5 fL (ref 80.0–100.0)
Monocytes Absolute: 0.8 10*3/uL (ref 0.1–1.0)
Monocytes Relative: 10 %
Neutro Abs: 6.3 10*3/uL (ref 1.7–7.7)
Neutrophils Relative %: 72 %
Platelets: 276 10*3/uL (ref 150–400)
RBC: 4.52 MIL/uL (ref 3.87–5.11)
RDW: 15.3 % (ref 11.5–15.5)
WBC: 8.6 10*3/uL (ref 4.0–10.5)
nRBC: 0 % (ref 0.0–0.2)

## 2021-06-27 LAB — CBG MONITORING, ED: Glucose-Capillary: 227 mg/dL — ABNORMAL HIGH (ref 70–99)

## 2021-06-27 NOTE — ED Provider Notes (Signed)
Emergency Medicine Provider Triage Evaluation Note  Terri Webster , a 55 y.o. female  was evaluated in triage.  Pt complains of left leg swelling.  The patient reports a 3-day history of pain and swelling in her left lower leg, ankle, and foot.  She also reports worsening pain to her left foot, ankle, and lower leg.  No right leg pain or swelling, chest pain, shortness of breath, fever, chills, wounds, or recent trauma or injury.  She does not take OCPs.  No recent long travel or surgeries.  No personal or familial history of VTE.  She has a history of diabetes mellitus and reports that her sugars have been "all out of whack" recently.  Review of Systems  Positive: Arthralgias, myalgias, leg swelling Negative: Chest pain, shortness of breath, fever, chills, abdominal pain, vomiting, constipation, dizziness, headache, numbness, weakness  Physical Exam  BP 130/65 (BP Location: Left Arm)   Pulse 89   Temp 99.1 F (37.3 C)   Resp 20   SpO2 95%  Gen:   Awake, no distress   Resp:  Normal effort  MSK:   Moves extremities without difficulty  Other:  Tender to palpation to the lateral aspect of the left foot.  There is a wound noted to the dorsum of the left fourth toe.  She is also diffusely tender to palpation to the left ankle.  There is edema from the midfoot extending up the calf of the left leg.  She is neurovascular intact.  She is able to ambulate, but gait is antalgic.  Medical Decision Making  Medically screening exam initiated at 1:19 AM.  Appropriate orders placed.  Terri Webster was informed that the remainder of the evaluation will be completed by another provider, this initial triage assessment does not replace that evaluation, and the importance of remaining in the ED until their evaluation is complete.  Labs and imaging have been ordered.  She will require further work-up evaluation in the emergency department.   Barkley Boards, PA-C 06/27/21 0128    Shon Baton,  MD 06/27/21 [phone]

## 2021-06-27 NOTE — ED Triage Notes (Signed)
Pt c/o left lower leg pain x 3 days.

## 2021-06-27 NOTE — Progress Notes (Signed)
Patient given a food voucher and shelter resources

## 2021-06-27 NOTE — ED Notes (Signed)
To ultrasound

## 2021-06-27 NOTE — ED Provider Notes (Signed)
[facility] EMERGENCY DEPARTMENT Provider Note   CSN: [id number] Arrival date & time: 06/26/21  2352     History Chief Complaint  Patient presents with   Leg Pain    Terri Webster is a 55 y.o. female.  Patient with history of diabetes presents to the emergency department for evaluation of left leg pain and swelling starting about 3 days ago.  No injury at onset.  Symptoms gradually worsening.  Swelling is limited to below the knee.  No redness.  No chest pain or shortness of breath. Patient denies risk factors for pulmonary embolism including: unilateral leg swelling, history of DVT/PE/other blood clots, use of exogenous hormones, recent immobilizations, recent surgery, recent travel (>4hr segment), malignancy, hemoptysis.         Past Medical History:  Diagnosis Date   Anxiety    Bronchitis    Diabetes mellitus without complication (HCC)    GERD (gastroesophageal reflux disease)    Hypertension    Neuropathic pain     Patient Active Problem List   Diagnosis Date Noted   Protein-calorie malnutrition, severe (HCC) 04/11/2014   Bipolar 1 disorder, depressed, moderate (HCC) 04/11/2014   Drug overdose, multiple drugs 04/10/2014   Overdose 04/10/2014   Diabetes mellitus (HCC) 04/10/2014   Hypertension     Past Surgical History:  Procedure Laterality Date   CESAREAN SECTION       OB History   No obstetric history on file.     No family history on file.  Social History   Tobacco Use   Smoking status: Every Day    Packs/day: 1.00    Types: Cigarettes   Smokeless tobacco: Never  Substance Use Topics   Alcohol use: No   Drug use: No    Home Medications Prior to Admission medications   Medication Sig Start Date End Date Taking? Authorizing Provider  amitriptyline (ELAVIL) 25 MG tablet Take 1 tablet (25 mg total) by mouth at bedtime. 04/14/14   Withrow, Everardo All, FNP  amoxicillin-clavulanate (AUGMENTIN) 875-125 MG tablet Take 1 tablet by  mouth 2 (two) times daily. 05/07/16   Cuthriell, Delorise Royals, PA-C  cetirizine (ZYRTEC) 10 MG tablet Take 1 tablet (10 mg total) by mouth daily. 05/07/16   Cuthriell, Delorise Royals, PA-C  fluticasone (FLONASE) 50 MCG/ACT nasal spray Place 1 spray into both nostrils 2 (two) times daily. 05/07/16   Cuthriell, Delorise Royals, PA-C  gabapentin (NEURONTIN) 300 MG capsule Take 1 capsule (300 mg total) by mouth 3 (three) times daily. 04/14/14   Withrow, Everardo All, FNP  HYDROcodone-acetaminophen (NORCO) 5-325 MG tablet Take 1 tablet by mouth every 4 (four) hours as needed for moderate pain. 09/19/16   Evon Slack, PA-C  ibuprofen (ADVIL,MOTRIN) 800 MG tablet Take 800 mg by mouth every 6 (six) hours as needed for moderate pain.     [provider]  insulin detemir (LEVEMIR) 100 UNIT/ML injection Inject 0.35 mLs (35 Units total) into the skin at bedtime. 04/14/14   Withrow, Everardo All, FNP  metFORMIN (GLUCOPHAGE) 500 MG tablet Take 2 tablets (1,000 mg total) by mouth daily with breakfast. 04/14/14   Withrow, Everardo All, FNP  predniSONE (DELTASONE) 50 MG tablet Take 1 tablet (50 mg total) by mouth daily with breakfast. 05/07/16   Cuthriell, Delorise Royals, PA-C  ranitidine (ZANTAC) 150 MG tablet Take 1 tablet (150 mg total) by mouth 2 (two) times daily. 04/14/14   Withrow, Everardo All, FNP  risperiDONE (RISPERDAL) 1 MG tablet Take 1 tablet (  1 mg total) by mouth daily. 04/14/14   Withrow, Everardo All, FNP  sertraline (ZOLOFT) 50 MG tablet Take 1 tablet (50 mg total) by mouth daily. 04/14/14   Withrow, Everardo All, FNP  sulfamethoxazole-trimethoprim (BACTRIM DS,SEPTRA DS) 800-160 MG tablet Take 1 tablet by mouth 2 (two) times daily. 09/19/16   Evon Slack, PA-C    Allergies    Patient has no known allergies.  Review of Systems   Review of Systems  Constitutional:  Negative for fever.  HENT:  Negative for rhinorrhea and sore throat.   Eyes:  Negative for redness.  Respiratory:  Negative for shortness of breath.   Cardiovascular:  Positive  for leg swelling. Negative for chest pain.  Gastrointestinal:  Negative for abdominal pain, diarrhea, nausea and vomiting.  Genitourinary:  Negative for dysuria, frequency, hematuria and urgency.  Musculoskeletal:  Positive for gait problem and myalgias. Negative for joint swelling.  Skin:  Negative for rash.  Neurological:  Negative for headaches.   Physical Exam Updated Vital Signs BP (!) 165/77 (BP Location: Left Arm)   Pulse (!) 102   Temp 98.6 F (37 C)   Resp 17   SpO2 98%   Physical Exam Vitals and nursing note reviewed.  Constitutional:      Appearance: She is well-developed.  HENT:     Head: Normocephalic and atraumatic.  Eyes:     Pupils: Pupils are equal, round, and reactive to light.  Cardiovascular:     Rate and Rhythm: Normal rate.     Pulses: Normal pulses. No decreased pulses.          Radial pulses are 2+ on the right side and 2+ on the left side.     Comments: Normal capillary refill, less than 2 seconds, of digits bilateral feet. Musculoskeletal:        General: Tenderness present.     Cervical back: Normal range of motion and neck supple.     Comments: Right lower extremity: Trace edema, no tenderness.  Left lower extremity swelling: 2-3+ edema, pitting, decreasing proximally but extending to the knee.  No redness or warmth or other signs of cellulitis.  Right forearm: Patient noticed a raised growth, nonfluctuant.  No significant pain, surrounding redness.  Skin:    General: Skin is warm and dry.  Neurological:     Mental Status: She is alert.     Sensory: No sensory deficit.     Comments: Motor, sensation, and vascular distal to the injury is fully intact.   Psychiatric:        Mood and Affect: Mood normal.    ED Results / Procedures / Treatments   Labs (all labs ordered are listed, but only abnormal results are displayed) Labs Reviewed  BASIC METABOLIC PANEL - Abnormal; Notable for the following components:      Result Value   Glucose, Bld  315 (*)    Calcium 8.8 (*)    All other components within normal limits  CBC WITH DIFFERENTIAL/PLATELET  CBG MONITORING, ED  CBG MONITORING, ED    EKG None  Radiology DG Ankle Complete Left  Result Date: 06/27/2021 CLINICAL DATA:  Left foot and ankle pain and swelling. Three day history. EXAM: LEFT FOOT - COMPLETE 3+ VIEW; LEFT ANKLE COMPLETE - 3+ VIEW COMPARISON:  None. FINDINGS: Three views of the left foot and three views of the left ankle. No evidence of acute fracture or dislocation. No focal bone lesions. Mild hallux valgus with degenerative changes of the first  metatarsal-phalangeal joint. Degenerative changes in the intertarsal joints. Old ununited ossicles adjacent to the cuboidal bone. Soft tissue swelling about the ankle. IMPRESSION: No acute displaced fractures identified. Degenerative changes. Soft tissue swelling. Electronically Signed   By: Burman Nieves M.D.   On: 06/27/2021 01:55   DG Foot Complete Left  Result Date: 06/27/2021 CLINICAL DATA:  Left foot and ankle pain and swelling. Three day history. EXAM: LEFT FOOT - COMPLETE 3+ VIEW; LEFT ANKLE COMPLETE - 3+ VIEW COMPARISON:  None. FINDINGS: Three views of the left foot and three views of the left ankle. No evidence of acute fracture or dislocation. No focal bone lesions. Mild hallux valgus with degenerative changes of the first metatarsal-phalangeal joint. Degenerative changes in the intertarsal joints. Old ununited ossicles adjacent to the cuboidal bone. Soft tissue swelling about the ankle. IMPRESSION: No acute displaced fractures identified. Degenerative changes. Soft tissue swelling. Electronically Signed   By: Burman Nieves M.D.   On: 06/27/2021 01:55    Procedures Procedures   Medications Ordered in ED Medications - No data to display  ED Course  I have reviewed the triage vital signs and the nursing notes.  Pertinent labs & imaging results that were available during my care of the patient were reviewed by  me and considered in my medical decision making (see chart for details).  Patient seen and examined.  Labs demonstrate normal white blood cell count, blood sugar in the 300s.  Will need to rule out DVT given asymmetric lower extremity swelling.  Vital signs reviewed and are as follows: BP (!) 165/77 (BP Location: Left Arm)   Pulse (!) 102   Temp 98.6 F (37 C)   Resp 17   SpO2 98%   2:23 PM DVT study is negative.  Patient updated.  Discussed elevation of the extremity and compression stockings.  Summary:  RIGHT:  - No evidence of common femoral vein obstruction.     LEFT:  - There is no evidence of deep vein thrombosis in the lower extremity.     - No cystic structure found in the popliteal fossa.   Encouraged PCP follow-up.  Vascular follow-up/referral given.  Encourage patient to return with worsening symptoms or other concerns.      MDM Rules/Calculators/A&P                           Patient with bilateral lower extremity swelling, pitting edema, worse on the left.  No signs of heart failure, liver failure, renal failure per history.  X-rays were negative.  DVT study was negative.  At this point, recommended symptomatic treatment with PCP/vascular follow-up.  No signs of arterial compromise in the leg.  No signs of infection or abscess.   Final Clinical Impression(s) / ED Diagnoses Final diagnoses:  Left leg swelling    Rx / DC Orders ED Discharge Orders     None        Renne Crigler, PA-C 06/27/21 1425    Horton, Clabe Seal, DO 06/28/21 1555

## 2021-06-27 NOTE — Progress Notes (Signed)
Lower extremity venous has been completed.   Preliminary results in CV Proc.   Blanch Media 06/27/2021 1:53 PM

## 2021-06-27 NOTE — ED Notes (Signed)
Called pt 3x no answer will try again later 

## 2021-06-27 NOTE — Discharge Instructions (Signed)
Please read and follow all provided instructions.  Your diagnoses today include:  1. Left leg swelling     Tests performed today include: Blood cell counts (white, red, and platelets) Electrolytes  Kidney function test Ultrasound - does not show any blood clots X-ray of your foot and ankle - no broken bones Vital signs. See below for your results today.   Medications prescribed:  None  Take any prescribed medications only as directed.  Additional Information:  Follow any educational materials contained in this packet.  Follow-up instructions: Please follow-up with your primary care provider or the vascular doctor for further evaluation of your symptoms.   Return instructions:  Please return to the Emergency Department if you experience worsening symptoms. Return immediately if you develop chest pain, shortness of breath, dizziness or fainting. Return with new color change or weakness/numbness to your affected leg(s). Return with bleeding, severe headache, confusion or altered mental status. Please return if you have any other emergent concerns.  Additional Information:  Your vital signs today were: BP (!) 168/69 (BP Location: Left Arm)   Pulse 86   Temp 99.1 F (37.3 C)   Resp 18   SpO2 99%  If your blood pressure (BP) was elevated above 135/85 this visit, please have this repeated by your doctor within one month. --------------

## 2021-06-29 ENCOUNTER — Other Ambulatory Visit: Payer: Self-pay

## 2021-06-29 ENCOUNTER — Emergency Department (HOSPITAL_COMMUNITY)
Admission: EM | Admit: 2021-06-29 | Discharge: 2021-06-29 | Disposition: A | Discharge status: Home / Self Care | Payer: Medicare Other | Attending: Emergency Medicine | Admitting: Emergency Medicine

## 2021-06-29 ENCOUNTER — Encounter (HOSPITAL_COMMUNITY): Payer: Self-pay

## 2021-06-29 DIAGNOSIS — E119 Type 2 diabetes mellitus without complications: Secondary | ICD-10-CM | POA: Insufficient documentation

## 2021-06-29 DIAGNOSIS — I10 Essential (primary) hypertension: Secondary | ICD-10-CM | POA: Diagnosis not present

## 2021-06-29 DIAGNOSIS — F410 Panic disorder [episodic paroxysmal anxiety] without agoraphobia: Secondary | ICD-10-CM | POA: Insufficient documentation

## 2021-06-29 DIAGNOSIS — Z7984 Long term (current) use of oral hypoglycemic drugs: Secondary | ICD-10-CM | POA: Diagnosis not present

## 2021-06-29 DIAGNOSIS — Z794 Long term (current) use of insulin: Secondary | ICD-10-CM | POA: Insufficient documentation

## 2021-06-29 DIAGNOSIS — F17210 Nicotine dependence, cigarettes, uncomplicated: Secondary | ICD-10-CM | POA: Diagnosis not present

## 2021-06-29 LAB — BASIC METABOLIC PANEL
Anion gap: 11 (ref 5–15)
BUN: 17 mg/dL (ref 6–20)
CO2: 30 mmol/L (ref 22–32)
Calcium: 9.4 mg/dL (ref 8.9–10.3)
Chloride: 100 mmol/L (ref 98–111)
Creatinine, Ser: 0.68 mg/dL (ref 0.44–1.00)
GFR, Estimated: >60 mL/min (ref >60)
Glucose, Bld: 132 mg/dL — ABNORMAL HIGH (ref 70–99)
Potassium: 3.4 mmol/L — ABNORMAL LOW (ref 3.5–5.1)
Sodium: 141 mmol/L (ref 135–145)

## 2021-06-29 LAB — CBC
HCT: 43.1 % (ref 36.0–46.0)
Hemoglobin: 13.5 g/dL (ref 12.0–15.0)
MCH: 30.6 pg (ref 26.0–34.0)
MCHC: 31.3 g/dL (ref 30.0–36.0)
MCV: 97.7 fL (ref 80.0–100.0)
Platelets: 314 10*3/uL (ref 150–400)
RBC: 4.41 MIL/uL (ref 3.87–5.11)
RDW: 15.2 % (ref 11.5–15.5)
WBC: 8.9 10*3/uL (ref 4.0–10.5)
nRBC: 0 % (ref 0.0–0.2)

## 2021-06-29 MED ORDER — ALPRAZOLAM 0.5 MG PO TABS: 0.5000 mg | ORAL_TABLET | Freq: Three times a day (TID) | ORAL | 0 refills | Status: AC | PRN

## 2021-06-29 MED ORDER — LORAZEPAM 1 MG PO TABS
1.0000 mg | ORAL_TABLET | Freq: Once | ORAL | Status: DC
  Filled 2021-06-29: qty 1

## 2021-06-29 NOTE — ED Notes (Signed)
Pt falling asleep during blood draw

## 2021-06-29 NOTE — ED Triage Notes (Signed)
Pt to ED via [facility] with c/o anxiety attack.  Pt states it began around 1900 but worsened as the evening progressed.  Hx of same but denies having had one in a long time and has been off her medications for about as long.

## 2021-06-29 NOTE — ED Provider Notes (Signed)
[facility]-EMERGENCY DEPT Provider Note   CSN: [id number] Arrival date & time: 06/29/21  0106     History Chief Complaint  Patient presents with   Anxiety    Terri Webster is a 55 y.o. female.  Patient presents to the emergency department for evaluation of anxiety and panic attack.  Patient reports that she has a history of similar.  She reports that she has been under a lot of stress lately.  Around 7 PM she started to have sensation of severe anxiety accompanied by palpitations.  Patient reports that she used to use Xanax for this but does not have any medications currently.      Past Medical History:  Diagnosis Date   Anxiety    Bronchitis    Diabetes mellitus without complication (HCC)    GERD (gastroesophageal reflux disease)    Hypertension    Neuropathic pain     Patient Active Problem List   Diagnosis Date Noted   Protein-calorie malnutrition, severe (HCC) 04/11/2014   Bipolar 1 disorder, depressed, moderate (HCC) 04/11/2014   Drug overdose, multiple drugs 04/10/2014   Overdose 04/10/2014   Diabetes mellitus (HCC) 04/10/2014   Hypertension     Past Surgical History:  Procedure Laterality Date   CESAREAN SECTION       OB History   No obstetric history on file.     History reviewed. No pertinent family history.  Social History   Tobacco Use   Smoking status: Every Day    Packs/day: 1.00    Types: Cigarettes   Smokeless tobacco: Never  Substance Use Topics   Alcohol use: No   Drug use: No    Home Medications Prior to Admission medications   Medication Sig Start Date End Date Taking? Authorizing Provider  ALPRAZolam Prudy Feeler) 0.5 MG tablet Take 1 tablet (0.5 mg total) by mouth 3 (three) times daily as needed for anxiety. 06/29/21  Yes Leeta Grimme, Canary Brim, MD  amitriptyline (ELAVIL) 25 MG tablet Take 1 tablet (25 mg total) by mouth at bedtime. 04/14/14   Withrow, Everardo All, FNP  amoxicillin-clavulanate (AUGMENTIN) 875-125 MG  tablet Take 1 tablet by mouth 2 (two) times daily. 05/07/16   Cuthriell, Delorise Royals, PA-C  cetirizine (ZYRTEC) 10 MG tablet Take 1 tablet (10 mg total) by mouth daily. 05/07/16   Cuthriell, Delorise Royals, PA-C  fluticasone (FLONASE) 50 MCG/ACT nasal spray Place 1 spray into both nostrils 2 (two) times daily. 05/07/16   Cuthriell, Delorise Royals, PA-C  gabapentin (NEURONTIN) 300 MG capsule Take 1 capsule (300 mg total) by mouth 3 (three) times daily. 04/14/14   Withrow, Everardo All, FNP  HYDROcodone-acetaminophen (NORCO) 5-325 MG tablet Take 1 tablet by mouth every 4 (four) hours as needed for moderate pain. 09/19/16   Evon Slack, PA-C  ibuprofen (ADVIL,MOTRIN) 800 MG tablet Take 800 mg by mouth every 6 (six) hours as needed for moderate pain.     [provider]  insulin detemir (LEVEMIR) 100 UNIT/ML injection Inject 0.35 mLs (35 Units total) into the skin at bedtime. 04/14/14   Withrow, Everardo All, FNP  metFORMIN (GLUCOPHAGE) 500 MG tablet Take 2 tablets (1,000 mg total) by mouth daily with breakfast. 04/14/14   Withrow, Everardo All, FNP  predniSONE (DELTASONE) 50 MG tablet Take 1 tablet (50 mg total) by mouth daily with breakfast. 05/07/16   Cuthriell, Delorise Royals, PA-C  ranitidine (ZANTAC) 150 MG tablet Take 1 tablet (150 mg total) by mouth 2 (two) times daily. 04/14/14  Withrow, Everardo AllJohn C, FNP  risperiDONE (RISPERDAL) 1 MG tablet Take 1 tablet (1 mg total) by mouth daily. 04/14/14   Withrow, Everardo AllJohn C, FNP  sertraline (ZOLOFT) 50 MG tablet Take 1 tablet (50 mg total) by mouth daily. 04/14/14   Withrow, Everardo AllJohn C, FNP  sulfamethoxazole-trimethoprim (BACTRIM DS,SEPTRA DS) 800-160 MG tablet Take 1 tablet by mouth 2 (two) times daily. 09/19/16   Evon SlackGaines, Thomas C, PA-C    Allergies    Patient has no known allergies.  Review of Systems   Review of Systems  Cardiovascular:  Positive for palpitations.  Psychiatric/Behavioral:  The patient is nervous/anxious.   All other systems reviewed and are negative.  Physical  Exam Updated Vital Signs BP (!) 148/69   Pulse 91   Temp 98.4 F (36.9 C) (Oral)   Resp 13   Ht 5\' 6"  (1.676 m)   Wt 90.3 kg   SpO2 92%   BMI 32.12 kg/m   Physical Exam Vitals and nursing note reviewed.  Constitutional:      General: She is not in acute distress.    Appearance: Normal appearance. She is well-developed.  HENT:     Head: Normocephalic and atraumatic.     Right Ear: Hearing normal.     Left Ear: Hearing normal.     Nose: Nose normal.  Eyes:     Conjunctiva/sclera: Conjunctivae normal.     Pupils: Pupils are equal, round, and reactive to light.  Cardiovascular:     Rate and Rhythm: Regular rhythm.     Heart sounds: S1 normal and S2 normal. No murmur heard.   No friction rub. No gallop.  Pulmonary:     Effort: Pulmonary effort is normal. No respiratory distress.     Breath sounds: Normal breath sounds.  Chest:     Chest wall: No tenderness.  Abdominal:     General: Bowel sounds are normal.     Palpations: Abdomen is soft.     Tenderness: There is no abdominal tenderness. There is no guarding or rebound. Negative signs include Murphy's sign and McBurney's sign.     Hernia: No hernia is present.  Musculoskeletal:        General: Normal range of motion.     Cervical back: Normal range of motion and neck supple.  Skin:    General: Skin is warm and dry.     Findings: No rash.  Neurological:     Mental Status: She is alert and oriented to person, place, and time.     GCS: GCS eye subscore is 4. GCS verbal subscore is 5. GCS motor subscore is 6.     Cranial Nerves: No cranial nerve deficit.     Sensory: No sensory deficit.     Coordination: Coordination normal.  Psychiatric:        Attention and Perception: Attention normal.        Mood and Affect: Mood is anxious.        Speech: Speech normal.        Behavior: Behavior normal.        Thought Content: Thought content normal.    ED Results / Procedures / Treatments   Labs (all labs ordered are listed,  but only abnormal results are displayed) Labs Reviewed  BASIC METABOLIC PANEL - Abnormal; Notable for the following components:      Result Value   Potassium 3.4 (*)    Glucose, Bld 132 (*)    All other components within normal limits  CBC  EKG EKG Interpretation  Date/Time:  Friday June 29 2021 01:26:07 EDT Ventricular Rate:  81 PR Interval:  126 QRS Duration: 85 QT Interval:  360 QTC Calculation: 418 R Axis:   51 Text Interpretation: Sinus rhythm Probable left atrial enlargement Minimal ST elevation, anterior leads Confirmed by Gilda Crease [phone]) on 06/29/2021 2:08:44 AM  Radiology VAS [place] LOWER EXTREMITY VENOUS (DVT) (ONLY [facility] & [facility])  Result Date: 06/27/2021  Lower Venous DVT Study Patient Name:  LASHAWNTA BURGERT  Date of Exam:   06/27/2021 Medical Rec #: [id number]       Accession #:    [id number] Date of Birth: September 02, 1966       Patient Gender: F Patient Age:   76 years Exam Location:  [facility] Procedure:      VAS [place] LOWER EXTREMITY VENOUS (DVT) Referring Phys: Ivin Booty GEIPLE --------------------------------------------------------------------------------  Indications: Edema.  Comparison Study: no prior Performing Technologist: [place] Ponder RVS  Examination Guidelines: A complete evaluation includes B-mode imaging, spectral Doppler, color Doppler, and power Doppler as needed of all accessible portions of each vessel. Bilateral testing is considered an integral part of a complete examination. Limited examinations for reoccurring indications may be performed as noted. The reflux portion of the exam is performed with the patient in reverse Trendelenburg.  +-----+---------------+---------+-----------+----------+--------------+ RIGHTCompressibilityPhasicitySpontaneityPropertiesThrombus Aging +-----+---------------+---------+-----------+----------+--------------+ CFV  Full           Yes      Yes                                  +-----+---------------+---------+-----------+----------+--------------+   +---------+---------------+---------+-----------+----------+--------------+ LEFT     CompressibilityPhasicitySpontaneityPropertiesThrombus Aging +---------+---------------+---------+-----------+----------+--------------+ CFV      Full           Yes      Yes                                 +---------+---------------+---------+-----------+----------+--------------+ SFJ      Full                                                        +---------+---------------+---------+-----------+----------+--------------+ FV Prox  Full                                                        +---------+---------------+---------+-----------+----------+--------------+ FV Mid   Full                                                        +---------+---------------+---------+-----------+----------+--------------+ FV DistalFull                                                        +---------+---------------+---------+-----------+----------+--------------+ PFV      Full                                                        +---------+---------------+---------+-----------+----------+--------------+  POP      Full           Yes      Yes                                 +---------+---------------+---------+-----------+----------+--------------+ PTV      Full                                                        +---------+---------------+---------+-----------+----------+--------------+ PERO     Full                                                        +---------+---------------+---------+-----------+----------+--------------+     Summary: RIGHT: - No evidence of common femoral vein obstruction.  LEFT: - There is no evidence of deep vein thrombosis in the lower extremity.  - No cystic structure found in the popliteal fossa.  *See table(s) above for measurements and observations. Electronically signed  by Coral Else MD on 06/27/2021 at 8:12:54 PM.    Final     Procedures Procedures   Medications Ordered in ED Medications  LORazepam (ATIVAN) tablet 1 mg (0 mg Oral Hold 06/29/21 0224)    ED Course  I have reviewed the triage vital signs and the nursing notes.  Pertinent labs & imaging results that were available during my care of the patient were reviewed by me and considered in my medical decision making (see chart for details).    MDM Rules/Calculators/A&P                           Patient presents to the emergency department for evaluation of anxiety.  Patient has a history of anxiety and panic disorder.  She has been off of her Xanax.  Patient has been under a great deal of stress and had increased anxiety earlier today.  It did not resolve on its own so she presented to the ER.  She appears well at arrival, work-up unremarkable.  Patient feeling much better after Ativan.  Final Clinical Impression(s) / ED Diagnoses Final diagnoses:  Panic attack    Rx / DC Orders ED Discharge Orders          Ordered    ALPRAZolam (XANAX) 0.5 MG tablet  3 times daily PRN        06/29/21 0336             Gilda Crease, MD 06/29/21 [phone]

## 2022-05-17 IMAGING — CR DG FOOT COMPLETE 3+V*L*
3 series · 3 of 3 positions shown · non-contrast
Comparison: None.

CLINICAL DATA: Left foot and ankle pain and swelling. Three day
history.

EXAM:
LEFT FOOT - COMPLETE 3+ VIEW; LEFT ANKLE COMPLETE - 3+ VIEW

[foot ap]
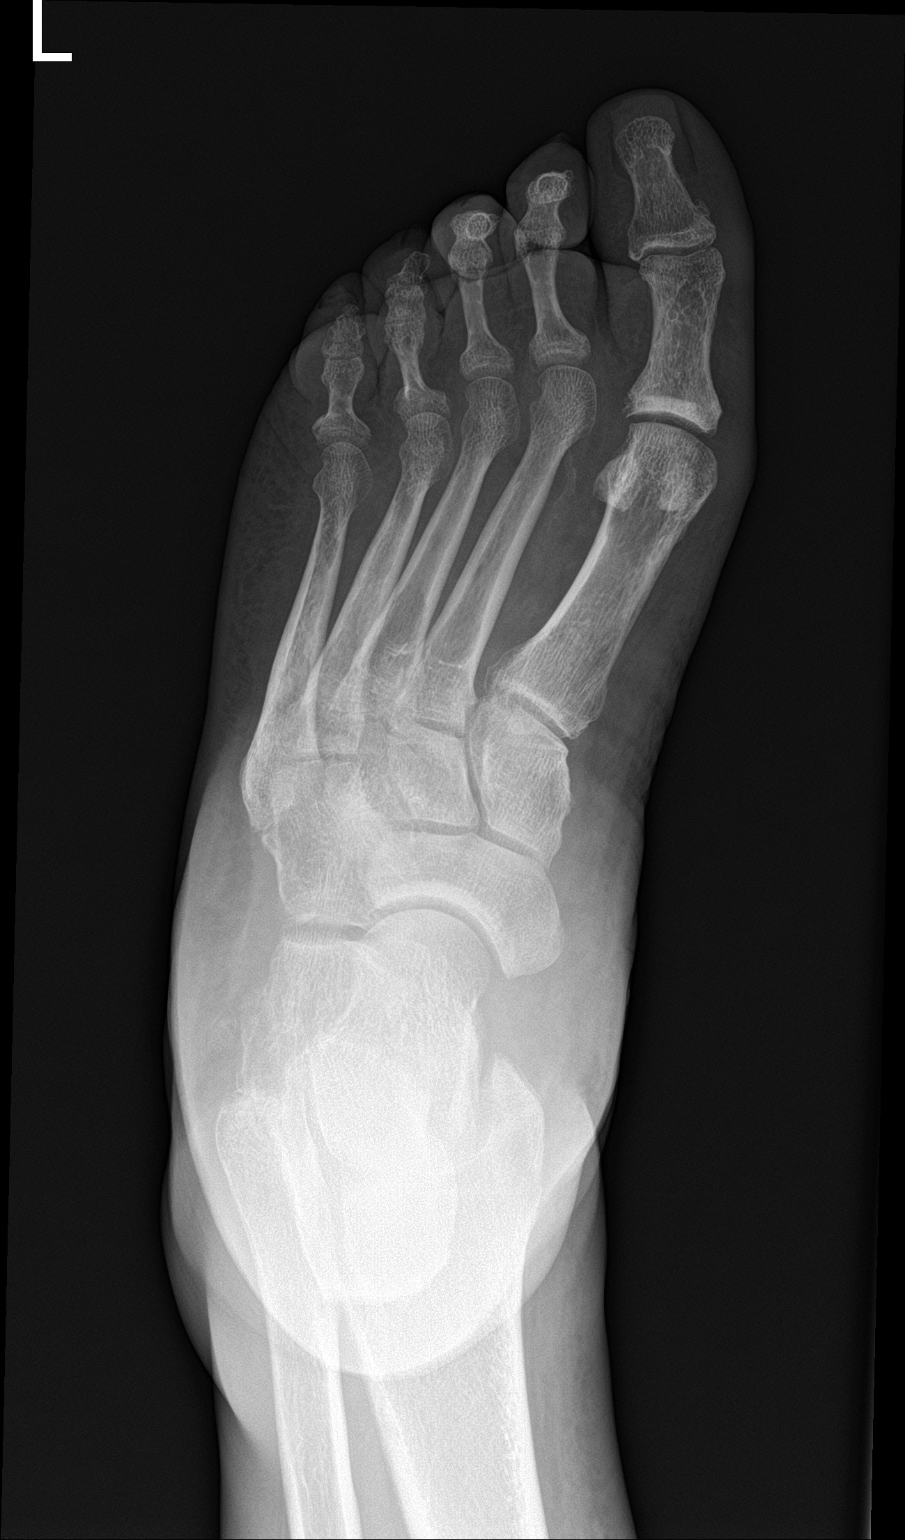

[foot obl]
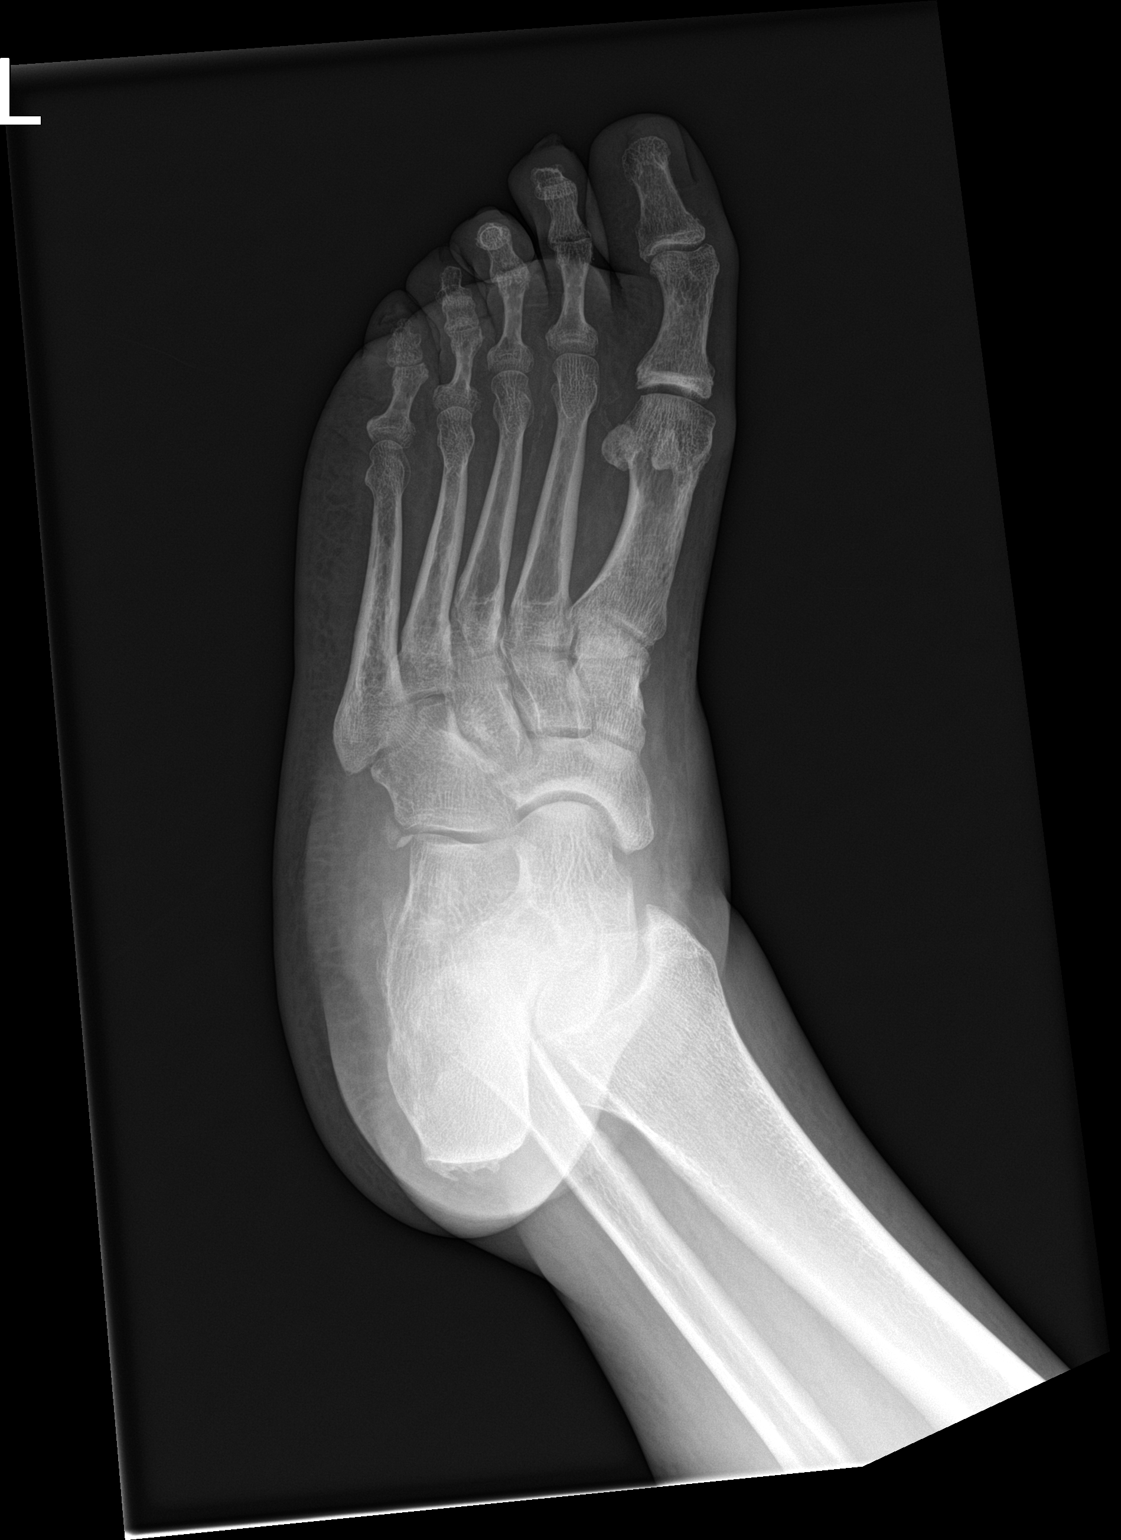

[foot lat]
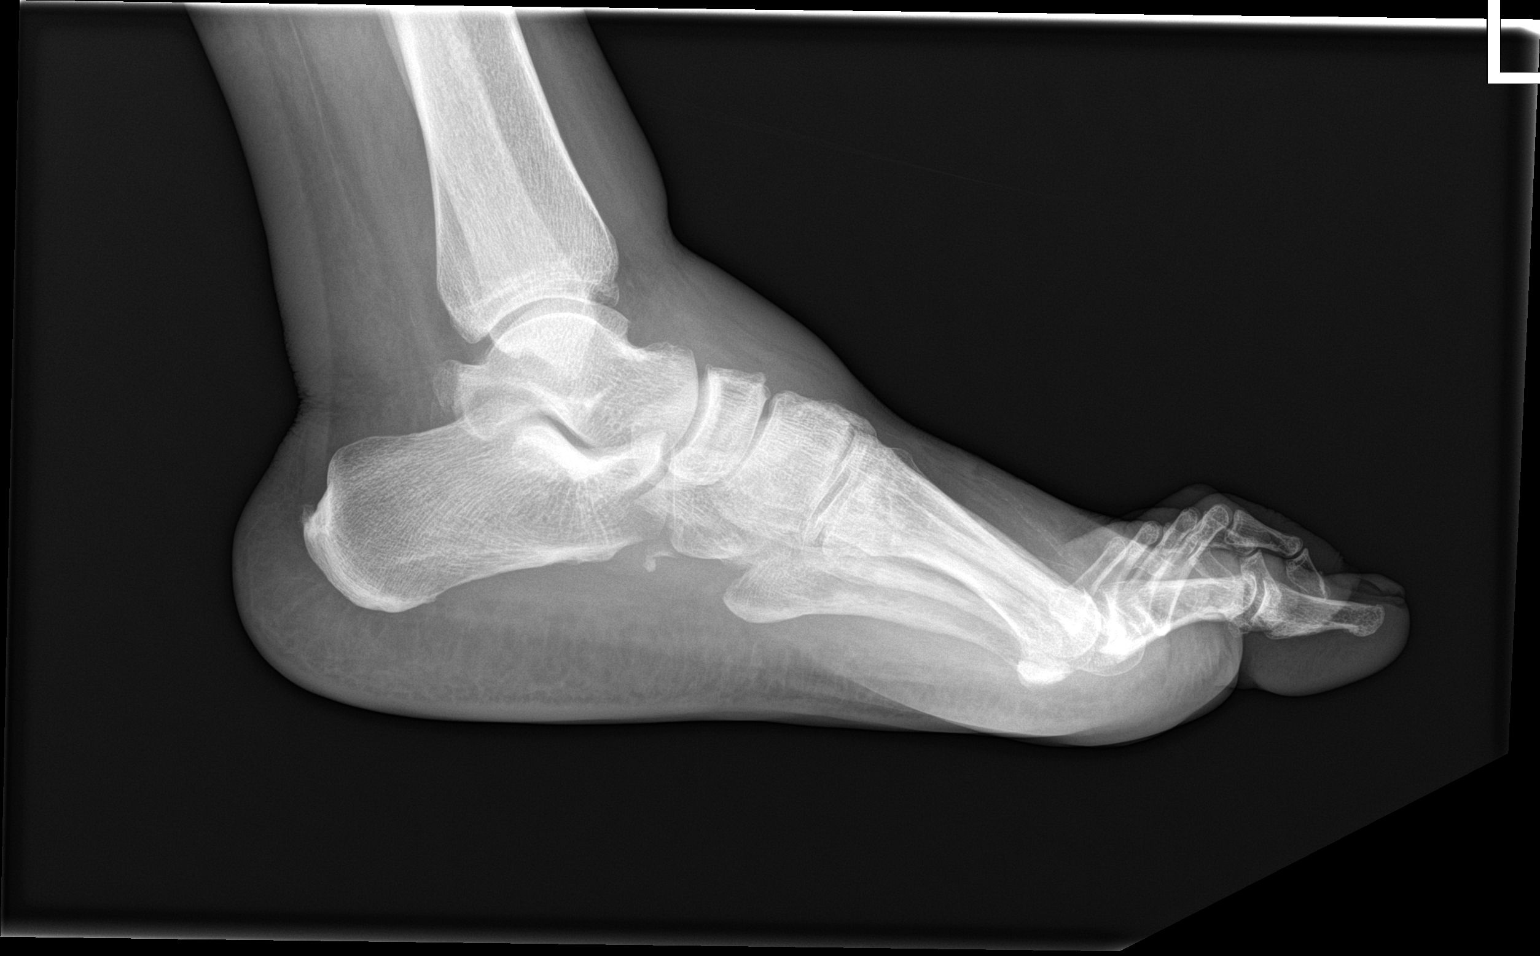

[3 of 3 positions shown; findings below may reference images not displayed]

FINDINGS: Three views of the left foot and three views of the left ankle.

No evidence of acute fracture or dislocation. No focal bone lesions.
Mild hallux valgus with degenerative changes of the first
metatarsal-phalangeal joint. Degenerative changes in the intertarsal
joints. Old ununited ossicles adjacent to the cuboidal bone. Soft
tissue swelling about the ankle.
IMPRESSION: No acute displaced fractures identified. Degenerative changes. Soft
tissue swelling.
